# Patient Record
Sex: Male | Born: 1980 | Race: Black or African American | Hispanic: No | Marital: Single | Smoking: Never smoker
Health system: Southern US, Community
[De-identification: ages and names within clinical notes are randomized; demographics above are authoritative.]

## PROBLEM LIST (undated history)

## (undated) DIAGNOSIS — E119 Type 2 diabetes mellitus without complications: Secondary | ICD-10-CM

## (undated) HISTORY — PX: COSMETIC SURGERY: SHX468

---

## 2000-12-02 ENCOUNTER — Emergency Department (HOSPITAL_COMMUNITY): Admission: EM | Admit: 2000-12-02 | Discharge: 2000-12-02 | Payer: Self-pay | Admitting: Emergency Medicine

## 2001-09-04 ENCOUNTER — Emergency Department (HOSPITAL_COMMUNITY): Admission: EM | Admit: 2001-09-04 | Discharge: 2001-09-04 | Payer: Self-pay | Admitting: *Deleted

## 2001-09-09 ENCOUNTER — Emergency Department (HOSPITAL_COMMUNITY): Admission: EM | Admit: 2001-09-09 | Discharge: 2001-09-09 | Payer: Self-pay | Admitting: Emergency Medicine

## 2003-07-15 ENCOUNTER — Emergency Department (HOSPITAL_COMMUNITY): Admission: EM | Admit: 2003-07-15 | Discharge: 2003-07-15 | Payer: Self-pay | Admitting: *Deleted

## 2003-07-30 ENCOUNTER — Emergency Department (HOSPITAL_COMMUNITY): Admission: EM | Admit: 2003-07-30 | Discharge: 2003-07-30 | Payer: Self-pay | Admitting: Emergency Medicine

## 2005-11-28 ENCOUNTER — Emergency Department (HOSPITAL_COMMUNITY): Admission: EM | Admit: 2005-11-28 | Discharge: 2005-11-28 | Payer: Self-pay | Admitting: Emergency Medicine

## 2006-09-22 ENCOUNTER — Emergency Department (HOSPITAL_COMMUNITY): Admission: EM | Admit: 2006-09-22 | Discharge: 2006-09-22 | Payer: Self-pay | Admitting: Emergency Medicine

## 2006-12-27 ENCOUNTER — Inpatient Hospital Stay (HOSPITAL_COMMUNITY): Admission: EM | Admit: 2006-12-27 | Discharge: 2006-12-29 | Payer: Self-pay | Admitting: Emergency Medicine

## 2007-01-01 ENCOUNTER — Emergency Department (HOSPITAL_COMMUNITY): Admission: EM | Admit: 2007-01-01 | Discharge: 2007-01-01 | Payer: Self-pay | Admitting: Emergency Medicine

## 2007-06-30 ENCOUNTER — Emergency Department (HOSPITAL_COMMUNITY): Admission: EM | Admit: 2007-06-30 | Discharge: 2007-06-30 | Payer: Self-pay | Admitting: Emergency Medicine

## 2009-03-20 ENCOUNTER — Emergency Department (HOSPITAL_COMMUNITY): Admission: EM | Admit: 2009-03-20 | Discharge: 2009-03-20 | Payer: Self-pay | Admitting: Emergency Medicine

## 2009-03-29 ENCOUNTER — Emergency Department (HOSPITAL_COMMUNITY): Admission: EM | Admit: 2009-03-29 | Discharge: 2009-03-29 | Payer: Self-pay | Admitting: Emergency Medicine

## 2010-04-26 LAB — DIFFERENTIAL
Basophils Absolute: 0 10*3/uL (ref 0.0–0.1)
Basophils Relative: 0 % (ref 0–1)
Eosinophils Absolute: 0.1 10*3/uL (ref 0.0–0.7)
Eosinophils Relative: 1 % (ref 0–5)
Lymphocytes Relative: 15 % (ref 12–46)
Lymphs Abs: 1.5 10*3/uL (ref 0.7–4.0)
Monocytes Absolute: 1.1 10*3/uL — ABNORMAL HIGH (ref 0.1–1.0)
Monocytes Relative: 11 % (ref 3–12)
Neutro Abs: 7.5 10*3/uL (ref 1.7–7.7)
Neutrophils Relative %: 73 % (ref 43–77)

## 2010-04-26 LAB — BASIC METABOLIC PANEL WITH GFR
BUN: 12 mg/dL (ref 6–23)
CO2: 32 meq/L (ref 19–32)
Calcium: 9.4 mg/dL (ref 8.4–10.5)
Chloride: 100 meq/L (ref 96–112)
Creatinine, Ser: 1.02 mg/dL (ref 0.4–1.5)
GFR calc non Af Amer: >60 mL/min
Glucose, Bld: 93 mg/dL (ref 70–99)
Potassium: 4.1 meq/L (ref 3.5–5.1)
Sodium: 137 meq/L (ref 135–145)

## 2010-04-26 LAB — CBC
HCT: 49.5 % (ref 39.0–52.0)
Hemoglobin: 16.6 g/dL (ref 13.0–17.0)
MCHC: 33.6 g/dL (ref 30.0–36.0)
MCV: 84.8 fL (ref 78.0–100.0)
Platelets: 170 10*3/uL (ref 150–400)
RBC: 5.84 MIL/uL — ABNORMAL HIGH (ref 4.22–5.81)
RDW: 12.7 % (ref 11.5–15.5)
WBC: 10.2 10*3/uL (ref 4.0–10.5)

## 2010-04-26 LAB — GLUCOSE, CAPILLARY: Glucose-Capillary: 113 mg/dL — ABNORMAL HIGH (ref 70–99)

## 2010-04-26 LAB — URINALYSIS, ROUTINE W REFLEX MICROSCOPIC
Bilirubin Urine: NEGATIVE
Hgb urine dipstick: NEGATIVE
Protein, ur: NEGATIVE mg/dL
Urobilinogen, UA: 0.2 mg/dL (ref 0.0–1.0)

## 2010-04-26 LAB — RAPID URINE DRUG SCREEN, HOSP PERFORMED
Amphetamines: NOT DETECTED
Benzodiazepines: NOT DETECTED
Opiates: NOT DETECTED
Tetrahydrocannabinol: NOT DETECTED

## 2010-04-26 LAB — ETHANOL: Alcohol, Ethyl (B): <5 mg/dL (ref 0–10)
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
## 2010-11-14 LAB — I-STAT 8, (EC8 V) (CONVERTED LAB)
Acid-Base Excess: 3 — ABNORMAL HIGH
Chloride: 95 — ABNORMAL LOW
HCT: 55 — ABNORMAL HIGH
Hemoglobin: 18.7 — ABNORMAL HIGH
Operator id: 132501
Potassium: 5.5 — ABNORMAL HIGH
Sodium: 125 — ABNORMAL LOW
TCO2: 30
pH, Ven: 7.385 — ABNORMAL HIGH

## 2010-11-14 LAB — DIFFERENTIAL
Basophils Absolute: 0
Basophils Absolute: 0.1
Basophils Relative: 0
Eosinophils Absolute: 0.1 — ABNORMAL LOW
Eosinophils Relative: 1
Lymphocytes Relative: 22
Lymphs Abs: 1.4
Monocytes Relative: 7
Monocytes Relative: 8
Neutrophils Relative %: 69

## 2010-11-14 LAB — HEMOGLOBIN A1C
Hgb A1c MFr Bld: 14.2 — ABNORMAL HIGH
Hgb A1c MFr Bld: 14.4 — ABNORMAL HIGH
Mean Plasma Glucose: 435

## 2010-11-14 LAB — COMPREHENSIVE METABOLIC PANEL
ALT: 15
Alkaline Phosphatase: 43
BUN: 13
CO2: 30
Chloride: 101
GFR calc non Af Amer: >60
Glucose, Bld: 360 — ABNORMAL HIGH
Potassium: 3.7
Sodium: 134 — ABNORMAL LOW
Total Bilirubin: 1.1
Total Protein: 5.7 — ABNORMAL LOW

## 2010-11-14 LAB — BASIC METABOLIC PANEL
BUN: 18
Calcium: 9.2
Calcium: 9.6
GFR calc non Af Amer: >60
GFR calc non Af Amer: >60
Glucose, Bld: 166 — ABNORMAL HIGH
Potassium: 5.4 — ABNORMAL HIGH
Sodium: 128 — ABNORMAL LOW
Sodium: 139

## 2010-11-14 LAB — KETONES, QUALITATIVE: Acetone, Bld: NEGATIVE

## 2010-11-14 LAB — URINALYSIS, ROUTINE W REFLEX MICROSCOPIC
Bilirubin Urine: NEGATIVE
Hgb urine dipstick: NEGATIVE
Ketones, ur: NEGATIVE
Nitrite: NEGATIVE
Protein, ur: NEGATIVE
Urobilinogen, UA: 0.2

## 2010-11-14 LAB — CBC
HCT: 50.7
Hemoglobin: 14.8
MCHC: 33.6
Platelets: 164
Platelets: 179
RDW: 12.6
WBC: 6.4

## 2010-11-14 LAB — URINE MICROSCOPIC-ADD ON

## 2010-11-14 LAB — RAPID URINE DRUG SCREEN, HOSP PERFORMED
Amphetamines: NOT DETECTED
Benzodiazepines: NOT DETECTED
Cocaine: NOT DETECTED

## 2012-01-05 ENCOUNTER — Encounter (HOSPITAL_COMMUNITY): Payer: Self-pay

## 2012-01-05 ENCOUNTER — Emergency Department (HOSPITAL_COMMUNITY): Payer: No Typology Code available for payment source

## 2012-01-05 ENCOUNTER — Emergency Department (HOSPITAL_COMMUNITY)
Admission: EM | Admit: 2012-01-05 | Discharge: 2012-01-05 | Disposition: A | Discharge status: Home / Self Care | Payer: No Typology Code available for payment source | Attending: Emergency Medicine | Admitting: Emergency Medicine

## 2012-01-05 DIAGNOSIS — Y9389 Activity, other specified: Secondary | ICD-10-CM | POA: Insufficient documentation

## 2012-01-05 DIAGNOSIS — E119 Type 2 diabetes mellitus without complications: Secondary | ICD-10-CM | POA: Insufficient documentation

## 2012-01-05 DIAGNOSIS — S139XXA Sprain of joints and ligaments of unspecified parts of neck, initial encounter: Secondary | ICD-10-CM | POA: Insufficient documentation

## 2012-01-05 DIAGNOSIS — S161XXA Strain of muscle, fascia and tendon at neck level, initial encounter: Secondary | ICD-10-CM

## 2012-01-05 DIAGNOSIS — IMO0002 Reserved for concepts with insufficient information to code with codable children: Secondary | ICD-10-CM | POA: Insufficient documentation

## 2012-01-05 DIAGNOSIS — Y92410 Unspecified street and highway as the place of occurrence of the external cause: Secondary | ICD-10-CM | POA: Insufficient documentation

## 2012-01-05 HISTORY — DX: Type 2 diabetes mellitus without complications: E11.9

## 2012-01-05 MED ORDER — HYDROCODONE-ACETAMINOPHEN 5-325 MG PO TABS: 1.0000 | ORAL_TABLET | Freq: Four times a day (QID) | ORAL | Status: AC | PRN

## 2012-01-05 NOTE — ED Notes (Signed)
c-collar placed in triage d/t complaints of neck pain.  
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
## 2012-01-05 NOTE — ED Provider Notes (Signed)
History/physical exam/procedure(s) were performed by non-physician practitioner and as supervising physician I was immediately available for consultation/collaboration. I have reviewed all notes and am in agreement with care and plan.   Hilario Quarry, MD 01/05/12 [phone]
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
## 2012-02-06 ENCOUNTER — Emergency Department (HOSPITAL_COMMUNITY)
Admission: EM | Admit: 2012-02-06 | Discharge: 2012-02-06 | Disposition: A | Discharge status: Home / Self Care | Payer: No Typology Code available for payment source | Attending: Emergency Medicine | Admitting: Emergency Medicine

## 2012-02-06 ENCOUNTER — Encounter (HOSPITAL_COMMUNITY): Payer: Self-pay

## 2012-02-06 DIAGNOSIS — S335XXA Sprain of ligaments of lumbar spine, initial encounter: Secondary | ICD-10-CM | POA: Insufficient documentation

## 2012-02-06 DIAGNOSIS — X58XXXA Exposure to other specified factors, initial encounter: Secondary | ICD-10-CM | POA: Insufficient documentation

## 2012-02-06 DIAGNOSIS — E119 Type 2 diabetes mellitus without complications: Secondary | ICD-10-CM | POA: Insufficient documentation

## 2012-02-06 DIAGNOSIS — S39012A Strain of muscle, fascia and tendon of lower back, initial encounter: Secondary | ICD-10-CM

## 2012-02-06 DIAGNOSIS — Y929 Unspecified place or not applicable: Secondary | ICD-10-CM | POA: Insufficient documentation

## 2012-02-06 DIAGNOSIS — Y939 Activity, unspecified: Secondary | ICD-10-CM | POA: Insufficient documentation

## 2012-02-06 MED ORDER — CYCLOBENZAPRINE HCL 10 MG PO TABS
10.0000 mg | ORAL_TABLET | Freq: Once | ORAL | Status: AC
  Administered 2012-02-06: 10 mg via ORAL
  Filled 2012-02-06: qty 1

## 2012-02-06 MED ORDER — IBUPROFEN 800 MG PO TABS
800.0000 mg | ORAL_TABLET | Freq: Once | ORAL | Status: AC
  Administered 2012-02-06: 800 mg via ORAL
  Filled 2012-02-06: qty 1

## 2012-02-06 MED ORDER — CYCLOBENZAPRINE HCL 10 MG PO TABS: ORAL_TABLET | ORAL | Status: DC
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
## 2012-02-06 NOTE — ED Notes (Signed)
Pt reports cbg was 146 this morning.

## 2012-02-08 NOTE — ED Provider Notes (Signed)
Medical screening examination/treatment/procedure(s) were performed by non-physician practitioner and as supervising physician I was immediately available for consultation/collaboration.   Shelda Jakes, MD 02/08/12 2039

## 2012-06-11 ENCOUNTER — Emergency Department (HOSPITAL_COMMUNITY)
Admission: EM | Admit: 2012-06-11 | Discharge: 2012-06-12 | Disposition: A | Discharge status: Home / Self Care | Payer: Self-pay | Attending: Emergency Medicine | Admitting: Emergency Medicine

## 2012-06-11 ENCOUNTER — Encounter (HOSPITAL_COMMUNITY): Payer: Self-pay | Admitting: Emergency Medicine

## 2012-06-11 DIAGNOSIS — E1169 Type 2 diabetes mellitus with other specified complication: Secondary | ICD-10-CM | POA: Insufficient documentation

## 2012-06-11 DIAGNOSIS — T7492XA Unspecified child maltreatment, confirmed, initial encounter: Secondary | ICD-10-CM | POA: Insufficient documentation

## 2012-06-11 DIAGNOSIS — R739 Hyperglycemia, unspecified: Secondary | ICD-10-CM

## 2012-06-11 DIAGNOSIS — T7491XA Unspecified adult maltreatment, confirmed, initial encounter: Secondary | ICD-10-CM | POA: Insufficient documentation

## 2012-06-11 DIAGNOSIS — IMO0002 Reserved for concepts with insufficient information to code with codable children: Secondary | ICD-10-CM | POA: Insufficient documentation

## 2012-06-11 LAB — GLUCOSE, CAPILLARY: Glucose-Capillary: 446 mg/dL — ABNORMAL HIGH (ref 70–99)

## 2012-06-11 MED ORDER — SODIUM CHLORIDE 0.9 % IV BOLUS (SEPSIS)
1000.0000 mL | Freq: Once | INTRAVENOUS | Status: AC
  Administered 2012-06-12: 1000 mL via INTRAVENOUS

## 2012-06-11 MED ORDER — IBUPROFEN 800 MG PO TABS
800.0000 mg | ORAL_TABLET | Freq: Once | ORAL | Status: AC
  Administered 2012-06-12: 800 mg via ORAL
  Filled 2012-06-11: qty 1

## 2012-06-11 MED ORDER — INSULIN ASPART 100 UNIT/ML ~~LOC~~ SOLN
10.0000 [IU] | Freq: Once | SUBCUTANEOUS | Status: AC
  Administered 2012-06-12: 10 [IU] via INTRAVENOUS
  Filled 2012-06-11: qty 1
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
## 2012-12-24 ENCOUNTER — Emergency Department (HOSPITAL_COMMUNITY)
Admission: EM | Admit: 2012-12-24 | Discharge: 2012-12-24 | Disposition: A | Discharge status: Home / Self Care | Payer: No Typology Code available for payment source | Attending: Emergency Medicine | Admitting: Emergency Medicine

## 2012-12-24 ENCOUNTER — Encounter (HOSPITAL_COMMUNITY): Payer: Self-pay | Admitting: Emergency Medicine

## 2012-12-24 ENCOUNTER — Emergency Department (HOSPITAL_COMMUNITY): Payer: No Typology Code available for payment source

## 2012-12-24 DIAGNOSIS — E119 Type 2 diabetes mellitus without complications: Secondary | ICD-10-CM | POA: Insufficient documentation

## 2012-12-24 DIAGNOSIS — R0789 Other chest pain: Secondary | ICD-10-CM

## 2012-12-24 DIAGNOSIS — R0602 Shortness of breath: Secondary | ICD-10-CM | POA: Insufficient documentation

## 2012-12-24 DIAGNOSIS — R071 Chest pain on breathing: Secondary | ICD-10-CM | POA: Insufficient documentation

## 2012-12-24 DIAGNOSIS — Z794 Long term (current) use of insulin: Secondary | ICD-10-CM | POA: Insufficient documentation

## 2012-12-24 LAB — CBC WITH DIFFERENTIAL/PLATELET
Basophils Relative: 0 % (ref 0–1)
Eosinophils Absolute: 0 10*3/uL (ref 0.0–0.7)
HCT: 47 % (ref 39.0–52.0)
Hemoglobin: 16.3 g/dL (ref 13.0–17.0)
MCH: 29 pg (ref 26.0–34.0)
MCHC: 34.7 g/dL (ref 30.0–36.0)
Monocytes Absolute: 0.5 10*3/uL (ref 0.1–1.0)
Monocytes Relative: 9 % (ref 3–12)

## 2012-12-24 LAB — COMPREHENSIVE METABOLIC PANEL
Albumin: 4 g/dL (ref 3.5–5.2)
BUN: 12 mg/dL (ref 6–23)
Calcium: 9.7 mg/dL (ref 8.4–10.5)
Creatinine, Ser: 1.03 mg/dL (ref 0.50–1.35)
Total Protein: 7.3 g/dL (ref 6.0–8.3)

## 2012-12-24 LAB — GLUCOSE, CAPILLARY: Glucose-Capillary: 215 mg/dL — ABNORMAL HIGH (ref 70–99)

## 2012-12-24 LAB — TROPONIN I: Troponin I: <0.3 ng/mL (ref <0.30)

## 2012-12-24 MED ORDER — SODIUM CHLORIDE 0.9 % IV BOLUS (SEPSIS): 1000.0000 mL | Freq: Once | INTRAVENOUS | Status: DC

## 2012-12-24 MED ORDER — INSULIN ASPART 100 UNIT/ML ~~LOC~~ SOLN
20.0000 [IU] | Freq: Once | SUBCUTANEOUS | Status: AC
  Administered 2012-12-24: 20 [IU] via SUBCUTANEOUS
  Filled 2012-12-24: qty 1

## 2012-12-24 MED ORDER — HYDROCODONE-ACETAMINOPHEN 5-325 MG PO TABS
1.0000 | ORAL_TABLET | Freq: Once | ORAL | Status: AC
  Administered 2012-12-24: 1 via ORAL
  Filled 2012-12-24: qty 1

## 2012-12-24 MED ORDER — TRAMADOL HCL 50 MG PO TABS: 50.0000 mg | ORAL_TABLET | Freq: Four times a day (QID) | ORAL | Status: DC | PRN

## 2012-12-24 NOTE — ED Notes (Signed)
Complain of generalized chest pain. Worse when taking a deep breath

## 2012-12-24 NOTE — ED Provider Notes (Signed)
CSN: [id number]     Arrival date & time 12/24/12  1237 History  This chart was scribed for Benny Lennert, MD by Valera Castle, ED Scribe. This patient was seen in room APA05/APA05 and the patient's care was started at 1:53 PM.   Chief Complaint  Patient presents with  . Chest Pain   (Consider location/radiation/quality/duration/timing/severity/associated sxs/prior Treatment) Patient is a 32 y.o. male presenting with chest pain. The history is provided by the patient. No language interpreter was used.  Chest Pain Pain location:  L chest Pain radiates to:  Does not radiate Pain radiates to the back: no   Pain severity:  Moderate Onset quality:  Sudden Duration:  1 day Timing:  Intermittent Progression:  Unchanged Chronicity:  New Relieved by:  Nothing Worsened by:  Deep breathing and movement Associated symptoms: shortness of breath   Associated symptoms: no abdominal pain, no back pain, no cough, no fatigue and no headache    HPI Comments: Roy Savage is a 32 y.o. male with a h/o DM who presents to the Emergency Department complaining of sudden, moderate, constant, left sided chest pain, with associated SOB, onset last night. He reports that he is currently experiencing the chest pain. He reports that movement and deep breathing exacerbates the pain. He denies any other associated symptoms. He denies any other medical history.   PCP Kirstie Peri, MD  Past Medical History  Diagnosis Date  . Diabetes mellitus without complication    No past surgical history on file. No family history on file. History  Substance Use Topics  . Smoking status: Never Smoker   . Smokeless tobacco: Not on file  . Alcohol Use: No    Review of Systems  Constitutional: Negative for appetite change and fatigue.  HENT: Negative for congestion, ear discharge and sinus pressure.   Eyes: Negative for discharge.  Respiratory: Positive for shortness of breath. Negative for cough.   Cardiovascular:  Positive for chest pain.  Gastrointestinal: Negative for abdominal pain and diarrhea.  Genitourinary: Negative for frequency and hematuria.  Musculoskeletal: Negative for back pain.  Skin: Negative for rash.  Neurological: Negative for seizures and headaches.  Psychiatric/Behavioral: Negative for hallucinations.    Allergies  Review of patient's allergies indicates no known allergies.  Home Medications   Current Outpatient Rx  Name  Route  Sig  Dispense  Refill  . insulin glargine (LANTUS) 100 UNIT/ML injection   Subcutaneous   Inject 40 Units into the skin daily.          BP 134/70  Pulse 62  Temp(Src) 97.3 F (36.3 C) (Oral)  Resp 18  Ht 5\' 11"  (1.803 m)  Wt 180 lb (81.647 kg)  BMI 25.12 kg/m2  SpO2 99%  Physical Exam  Nursing note and vitals reviewed. Constitutional: He is oriented to person, place, and time. He appears well-developed.  HENT:  Head: Normocephalic.  Eyes: Conjunctivae and EOM are normal. No scleral icterus.  Neck: Neck supple. No thyromegaly present.  Cardiovascular: Normal rate and regular rhythm.  Exam reveals no gallop and no friction rub.   No murmur heard. Pulmonary/Chest: No stridor. He has no wheezes. He has no rales. He exhibits no tenderness.  Moderate tenderness to left anterior chest wall.  Abdominal: He exhibits no distension. There is no tenderness. There is no rebound.  Musculoskeletal: Normal range of motion. He exhibits no edema.  Lymphadenopathy:    He has no cervical adenopathy.  Neurological: He is oriented to person, place, and time.  He exhibits normal muscle tone. Coordination normal.  Skin: No rash noted. No erythema.  Psychiatric: He has a normal mood and affect. His behavior is normal.    ED Course  Procedures (including critical care time)  DIAGNOSTIC STUDIES: Oxygen Saturation is 99% on room air, normal by my interpretation.    COORDINATION OF CARE: 1:57 PM-Discussed treatment plan which includes CXR and EKG with  pt at bedside and pt agreed to plan.   Labs Review Labs Reviewed  COMPREHENSIVE METABOLIC PANEL - Abnormal; Notable for the following:    Sodium 129 (*)    Chloride 91 (*)    Glucose, Bld 586 (*)    All other components within normal limits  CBC WITH DIFFERENTIAL  TROPONIN I   Imaging Review Dg Chest 2 View  12/24/2012   CLINICAL DATA:  Chest pain and shortness of Breath.  EXAM: CHEST  2 VIEW  COMPARISON:  12/27/2006.  FINDINGS: The heart size and mediastinal contours are within normal limits. Both lungs are clear. The visualized skeletal structures are unremarkable. Stable mild mid thoracic compression deformity  IMPRESSION: No active cardiopulmonary disease.   Electronically Signed   By: Loralie Champagne M.D.   On: 12/24/2012 13:09    EKG Interpretation    Date/Time:  Wednesday December 24 2012 12:40:41 EST Ventricular Rate:  64 PR Interval:  126 QRS Duration: 112 QT Interval:  392 QTC Calculation: 404 R Axis:   58 Text Interpretation:  Normal sinus rhythm T wave abnormality, consider inferior ischemia Abnormal ECG When compared with ECG of 28-Dec-2006 01:22, No significant change was found Confirmed by COOK  MD, BRIAN ([id number]) on 12/24/2012 1:10:02 PM           Results for orders placed during the hospital encounter of 12/24/12  CBC WITH DIFFERENTIAL      Result Value Range   WBC 5.6  4.0 - 10.5 K/uL   RBC 5.62  4.22 - 5.81 MIL/uL   Hemoglobin 16.3  13.0 - 17.0 g/dL   HCT 11.9  14.7 - 82.9 %   MCV 83.6  78.0 - 100.0 fL   MCH 29.0  26.0 - 34.0 pg   MCHC 34.7  30.0 - 36.0 g/dL   RDW 56.2  13.0 - 86.5 %   Platelets 160  150 - 400 K/uL   Neutrophils Relative % 63  43 - 77 %   Neutro Abs 3.5  1.7 - 7.7 K/uL   Lymphocytes Relative 27  12 - 46 %   Lymphs Abs 1.5  0.7 - 4.0 K/uL   Monocytes Relative 9  3 - 12 %   Monocytes Absolute 0.5  0.1 - 1.0 K/uL   Eosinophils Relative 1  0 - 5 %   Eosinophils Absolute 0.0  0.0 - 0.7 K/uL   Basophils Relative 0  0 - 1 %    Basophils Absolute 0.0  0.0 - 0.1 K/uL  COMPREHENSIVE METABOLIC PANEL      Result Value Range   Sodium 129 (*) 135 - 145 mEq/L   Potassium 4.7  3.5 - 5.1 mEq/L   Chloride 91 (*) 96 - 112 mEq/L   CO2 30  19 - 32 mEq/L   Glucose, Bld 586 (*) 70 - 99 mg/dL   BUN 12  6 - 23 mg/dL   Creatinine, Ser 7.84  0.50 - 1.35 mg/dL   Calcium 9.7  8.4 - 69.6 mg/dL   Total Protein 7.3  6.0 - 8.3 g/dL   Albumin 4.0  3.5 - 5.2 g/dL   AST 15  0 - 37 U/L   ALT 13  0 - 53 U/L   Alkaline Phosphatase 49  39 - 117 U/L   Total Bilirubin 1.2  0.3 - 1.2 mg/dL   GFR calc non Af Amer >90  >90 mL/min   GFR calc Af Amer >90  >90 mL/min  TROPONIN I      Result Value Range   Troponin I <0.30  <0.30 ng/mL    MDM  No diagnosis found.   Chest wall pain,  Diabetes.  Pt to follow up with pcp   The chart was scribed for me under my direct supervision.  I personally performed the history, physical, and medical decision making and all procedures in the evaluation of this patient.Benny Lennert, MD 12/24/12 [phone]

## 2012-12-24 NOTE — ED Notes (Signed)
Pt alert & oriented x4, stable gait. Patient given discharge instructions, paperwork & prescription(s). Patient  instructed to stop at the registration desk to finish any additional paperwork. Patient verbalized understanding. Pt left department w/ no further questions. 

## 2013-11-05 ENCOUNTER — Encounter (HOSPITAL_COMMUNITY): Payer: Self-pay | Admitting: Emergency Medicine

## 2013-11-05 ENCOUNTER — Emergency Department (HOSPITAL_COMMUNITY): Payer: Self-pay

## 2013-11-05 ENCOUNTER — Inpatient Hospital Stay (HOSPITAL_COMMUNITY)
Admission: EM | Admit: 2013-11-05 | Discharge: 2013-11-07 | DRG: 638 | Disposition: A | Discharge status: Home / Self Care | Payer: Self-pay | Attending: Internal Medicine | Admitting: Internal Medicine

## 2013-11-05 DIAGNOSIS — E1310 Other specified diabetes mellitus with ketoacidosis without coma: Principal | ICD-10-CM | POA: Diagnosis present

## 2013-11-05 DIAGNOSIS — E872 Acidosis, unspecified: Secondary | ICD-10-CM

## 2013-11-05 DIAGNOSIS — E1110 Type 2 diabetes mellitus with ketoacidosis without coma: Secondary | ICD-10-CM | POA: Diagnosis present

## 2013-11-05 DIAGNOSIS — J029 Acute pharyngitis, unspecified: Secondary | ICD-10-CM | POA: Diagnosis present

## 2013-11-05 DIAGNOSIS — Z91013 Allergy to seafood: Secondary | ICD-10-CM

## 2013-11-05 DIAGNOSIS — E0810 Diabetes mellitus due to underlying condition with ketoacidosis without coma: Secondary | ICD-10-CM

## 2013-11-05 DIAGNOSIS — E875 Hyperkalemia: Secondary | ICD-10-CM

## 2013-11-05 DIAGNOSIS — Z9111 Patient's noncompliance with dietary regimen: Secondary | ICD-10-CM | POA: Diagnosis present

## 2013-11-05 DIAGNOSIS — Z9114 Patient's other noncompliance with medication regimen: Secondary | ICD-10-CM | POA: Diagnosis present

## 2013-11-05 DIAGNOSIS — Z23 Encounter for immunization: Secondary | ICD-10-CM

## 2013-11-05 LAB — BASIC METABOLIC PANEL
ANION GAP: 35 — AB (ref 5–15)
ANION GAP: 32 — AB (ref 5–15)
ANION GAP: 26 — AB (ref 5–15)
ANION GAP: 21 — AB (ref 5–15)
Anion gap: 38 — ABNORMAL HIGH (ref 5–15)
BUN: 34 mg/dL — ABNORMAL HIGH (ref 6–23)
BUN: 33 mg/dL — ABNORMAL HIGH (ref 6–23)
BUN: 33 mg/dL — ABNORMAL HIGH (ref 6–23)
BUN: 30 mg/dL — ABNORMAL HIGH (ref 6–23)
BUN: 27 mg/dL — ABNORMAL HIGH (ref 6–23)
CALCIUM: 10.3 mg/dL (ref 8.4–10.5)
CALCIUM: 10.5 mg/dL (ref 8.4–10.5)
CALCIUM: 10.3 mg/dL (ref 8.4–10.5)
CALCIUM: 10 mg/dL (ref 8.4–10.5)
CHLORIDE: 85 meq/L — AB (ref 96–112)
CO2: 11 mEq/L — ABNORMAL LOW (ref 19–32)
CO2: 11 meq/L — AB (ref 19–32)
CO2: 13 meq/L — AB (ref 19–32)
CO2: 15 mEq/L — ABNORMAL LOW (ref 19–32)
CO2: 19 meq/L (ref 19–32)
Calcium: 10.7 mg/dL — ABNORMAL HIGH (ref 8.4–10.5)
Chloride: 91 mEq/L — ABNORMAL LOW (ref 96–112)
Chloride: 96 mEq/L (ref 96–112)
Chloride: 98 mEq/L (ref 96–112)
Chloride: 99 mEq/L (ref 96–112)
Creatinine, Ser: 1.59 mg/dL — ABNORMAL HIGH (ref 0.50–1.35)
Creatinine, Ser: 1.44 mg/dL — ABNORMAL HIGH (ref 0.50–1.35)
Creatinine, Ser: 1.49 mg/dL — ABNORMAL HIGH (ref 0.50–1.35)
Creatinine, Ser: 1.41 mg/dL — ABNORMAL HIGH (ref 0.50–1.35)
Creatinine, Ser: 1.26 mg/dL (ref 0.50–1.35)
GFR calc non Af Amer: 55 mL/min — ABNORMAL LOW (ref >90)
GFR calc non Af Amer: 62 mL/min — ABNORMAL LOW (ref >90)
GFR calc non Af Amer: 59 mL/min — ABNORMAL LOW (ref >90)
GFR calc non Af Amer: 63 mL/min — ABNORMAL LOW (ref >90)
GFR, EST AFRICAN AMERICAN: 63 mL/min — AB (ref >90)
GFR, EST AFRICAN AMERICAN: 72 mL/min — AB (ref >90)
GFR, EST AFRICAN AMERICAN: 69 mL/min — AB (ref >90)
GFR, EST AFRICAN AMERICAN: 73 mL/min — AB (ref >90)
GFR, EST AFRICAN AMERICAN: 84 mL/min — AB (ref >90)
GFR, EST NON AFRICAN AMERICAN: 73 mL/min — AB (ref >90)
Glucose, Bld: 692 mg/dL (ref 70–99)
Glucose, Bld: 629 mg/dL (ref 70–99)
Glucose, Bld: 494 mg/dL — ABNORMAL HIGH (ref 70–99)
Glucose, Bld: 355 mg/dL — ABNORMAL HIGH (ref 70–99)
Glucose, Bld: 262 mg/dL — ABNORMAL HIGH (ref 70–99)
POTASSIUM: 7.4 meq/L — AB (ref 3.7–5.3)
Potassium: 6.9 mEq/L (ref 3.7–5.3)
Potassium: 5.4 mEq/L — ABNORMAL HIGH (ref 3.7–5.3)
Potassium: 5.1 mEq/L (ref 3.7–5.3)
Potassium: 4.9 mEq/L (ref 3.7–5.3)
SODIUM: 134 meq/L — AB (ref 137–147)
SODIUM: 137 meq/L (ref 137–147)
SODIUM: 141 meq/L (ref 137–147)
SODIUM: 139 meq/L (ref 137–147)
SODIUM: 139 meq/L (ref 137–147)

## 2013-11-05 LAB — GLUCOSE, CAPILLARY
GLUCOSE-CAPILLARY: 590 mg/dL — AB (ref 70–99)
GLUCOSE-CAPILLARY: 366 mg/dL — AB (ref 70–99)
GLUCOSE-CAPILLARY: 169 mg/dL — AB (ref 70–99)
Glucose-Capillary: 500 mg/dL — ABNORMAL HIGH (ref 70–99)
Glucose-Capillary: 446 mg/dL — ABNORMAL HIGH (ref 70–99)
Glucose-Capillary: 280 mg/dL — ABNORMAL HIGH (ref 70–99)
Glucose-Capillary: 230 mg/dL — ABNORMAL HIGH (ref 70–99)
Glucose-Capillary: 207 mg/dL — ABNORMAL HIGH (ref 70–99)

## 2013-11-05 LAB — URINALYSIS, ROUTINE W REFLEX MICROSCOPIC
Bilirubin Urine: NEGATIVE
Glucose, UA: >1000 mg/dL — AB
Ketones, ur: 40 mg/dL — AB
LEUKOCYTES UA: NEGATIVE
Nitrite: NEGATIVE
Protein, ur: NEGATIVE mg/dL
Specific Gravity, Urine: 1.025 (ref 1.005–1.030)
Urobilinogen, UA: 0.2 mg/dL (ref 0.0–1.0)
pH: 5.5 (ref 5.0–8.0)

## 2013-11-05 LAB — CBC WITH DIFFERENTIAL/PLATELET
BASOS ABS: 0 10*3/uL (ref 0.0–0.1)
BASOS PCT: 0 % (ref 0–1)
Eosinophils Absolute: 0 10*3/uL (ref 0.0–0.7)
Eosinophils Relative: 0 % (ref 0–5)
HCT: 51.7 % (ref 39.0–52.0)
Hemoglobin: 17.8 g/dL — ABNORMAL HIGH (ref 13.0–17.0)
Lymphocytes Relative: 5 % — ABNORMAL LOW (ref 12–46)
Lymphs Abs: 0.9 10*3/uL (ref 0.7–4.0)
MCH: 29.4 pg (ref 26.0–34.0)
MCHC: 34.4 g/dL (ref 30.0–36.0)
MCV: 85.5 fL (ref 78.0–100.0)
Monocytes Absolute: 1 10*3/uL (ref 0.1–1.0)
Monocytes Relative: 5 % (ref 3–12)
NEUTROS ABS: 16 10*3/uL — AB (ref 1.7–7.7)
NEUTROS PCT: 90 % — AB (ref 43–77)
Platelets: 192 10*3/uL (ref 150–400)
RBC: 6.05 MIL/uL — ABNORMAL HIGH (ref 4.22–5.81)
RDW: 12.8 % (ref 11.5–15.5)
WBC: 17.9 10*3/uL — ABNORMAL HIGH (ref 4.0–10.5)

## 2013-11-05 LAB — CBC
HCT: 49.4 % (ref 39.0–52.0)
Hemoglobin: 17.4 g/dL — ABNORMAL HIGH (ref 13.0–17.0)
MCH: 29.7 pg (ref 26.0–34.0)
MCHC: 35.2 g/dL (ref 30.0–36.0)
MCV: 84.4 fL (ref 78.0–100.0)
PLATELETS: 191 10*3/uL (ref 150–400)
RBC: 5.85 MIL/uL — ABNORMAL HIGH (ref 4.22–5.81)
RDW: 12.7 % (ref 11.5–15.5)
WBC: 18.7 10*3/uL — AB (ref 4.0–10.5)

## 2013-11-05 LAB — URINE MICROSCOPIC-ADD ON

## 2013-11-05 LAB — BLOOD GAS, ARTERIAL
Acid-base deficit: 17.6 mmol/L — ABNORMAL HIGH (ref 0.0–2.0)
Bicarbonate: 9.3 mEq/L — ABNORMAL LOW (ref 20.0–24.0)
DRAWN BY: 27407
FIO2: 0.21 %
O2 Saturation: 95.5 %
PO2 ART: 101 mmHg — AB (ref 80.0–100.0)
Patient temperature: 37
TCO2: 8.4 mmol/L (ref 0–100)
pCO2 arterial: 27 mmHg — ABNORMAL LOW (ref 35.0–45.0)
pH, Arterial: 7.164 — CL (ref 7.350–7.450)

## 2013-11-05 LAB — I-STAT CG4 LACTIC ACID, ED: Lactic Acid, Venous: 7.94 mmol/L — ABNORMAL HIGH (ref 0.5–2.2)

## 2013-11-05 LAB — RAPID STREP SCREEN (MED CTR MEBANE ONLY): STREPTOCOCCUS, GROUP A SCREEN (DIRECT): NEGATIVE

## 2013-11-05 LAB — CBG MONITORING, ED: Glucose-Capillary: >600 mg/dL (ref 70–99)

## 2013-11-05 LAB — KETONES, QUALITATIVE

## 2013-11-05 MED ORDER — SODIUM CHLORIDE 0.9 % IV SOLN
INTRAVENOUS | Status: DC
  Administered 2013-11-05: 800 mL via INTRAVENOUS
  Administered 2013-11-05: 22:00:00 via INTRAVENOUS

## 2013-11-05 MED ORDER — SODIUM CHLORIDE 0.9 % IV SOLN
1000.0000 mL | Freq: Once | INTRAVENOUS | Status: AC
  Administered 2013-11-05: 1000 mL via INTRAVENOUS

## 2013-11-05 MED ORDER — ACETAMINOPHEN 650 MG RE SUPP: 650.0000 mg | Freq: Four times a day (QID) | RECTAL | Status: DC | PRN

## 2013-11-05 MED ORDER — SODIUM CHLORIDE 0.9 % IV SOLN: INTRAVENOUS | Status: AC

## 2013-11-05 MED ORDER — INSULIN REGULAR HUMAN 100 UNIT/ML IJ SOLN: INTRAMUSCULAR | Status: DC

## 2013-11-05 MED ORDER — DEXTROSE-NACL 5-0.45 % IV SOLN
INTRAVENOUS | Status: DC
  Administered 2013-11-06 (×2): via INTRAVENOUS

## 2013-11-05 MED ORDER — SODIUM BICARBONATE 8.4 % IV SOLN
50.0000 meq | Freq: Once | INTRAVENOUS | Status: AC
  Administered 2013-11-05: 50 meq via INTRAVENOUS
  Filled 2013-11-05: qty 50

## 2013-11-05 MED ORDER — SODIUM CHLORIDE 0.9 % IJ SOLN
3.0000 mL | Freq: Two times a day (BID) | INTRAMUSCULAR | Status: DC
  Administered 2013-11-05: 22:00:00 via INTRAVENOUS
  Administered 2013-11-06 – 2013-11-07 (×3): 3 mL via INTRAVENOUS

## 2013-11-05 MED ORDER — INFLUENZA VAC SPLIT QUAD 0.5 ML IM SUSY
0.5000 mL | PREFILLED_SYRINGE | INTRAMUSCULAR | Status: AC
  Administered 2013-11-06: 0.5 mL via INTRAMUSCULAR
  Filled 2013-11-05: qty 0.5

## 2013-11-05 MED ORDER — DEXTROSE 50 % IV SOLN: 25.0000 mL | INTRAVENOUS | Status: DC | PRN

## 2013-11-05 MED ORDER — ACETAMINOPHEN 325 MG PO TABS: 650.0000 mg | ORAL_TABLET | Freq: Four times a day (QID) | ORAL | Status: DC | PRN

## 2013-11-05 MED ORDER — PNEUMOCOCCAL VAC POLYVALENT 25 MCG/0.5ML IJ INJ
0.5000 mL | INJECTION | INTRAMUSCULAR | Status: AC
  Administered 2013-11-06: 0.5 mL via INTRAMUSCULAR
  Filled 2013-11-05: qty 0.5

## 2013-11-05 MED ORDER — SODIUM CHLORIDE 0.9 % IV SOLN: INTRAVENOUS | Status: DC

## 2013-11-05 MED ORDER — HEPARIN SODIUM (PORCINE) 5000 UNIT/ML IJ SOLN
5000.0000 [IU] | Freq: Three times a day (TID) | INTRAMUSCULAR | Status: DC
  Administered 2013-11-05 – 2013-11-06 (×4): 5000 [IU] via SUBCUTANEOUS
  Filled 2013-11-05 (×5): qty 1

## 2013-11-05 MED ORDER — SODIUM CHLORIDE 0.9 % IV SOLN
1000.0000 mL | INTRAVENOUS | Status: DC
  Administered 2013-11-05: 1000 mL via INTRAVENOUS

## 2013-11-05 MED ORDER — SODIUM CHLORIDE 0.9 % IV SOLN
INTRAVENOUS | Status: DC
  Administered 2013-11-05: 10.6 [IU]/h via INTRAVENOUS
  Administered 2013-11-05: 15:00:00 via INTRAVENOUS
  Administered 2013-11-05: 8.8 [IU]/h via INTRAVENOUS
  Filled 2013-11-05: qty 2.5

## 2013-11-05 MED ORDER — ONDANSETRON HCL 4 MG/2ML IJ SOLN
4.0000 mg | Freq: Once | INTRAMUSCULAR | Status: AC
  Administered 2013-11-05: 4 mg via INTRAVENOUS
  Filled 2013-11-05: qty 2

## 2013-11-05 MED ORDER — ONDANSETRON HCL 4 MG/2ML IJ SOLN: 4.0000 mg | Freq: Three times a day (TID) | INTRAMUSCULAR | Status: AC | PRN

## 2013-11-05 MED ORDER — SODIUM CHLORIDE 0.9 % IV SOLN
1.0000 g | Freq: Once | INTRAVENOUS | Status: AC
  Administered 2013-11-05: 1 g via INTRAVENOUS
  Filled 2013-11-05: qty 10

## 2013-11-05 MED ORDER — MORPHINE SULFATE 2 MG/ML IJ SOLN: 2.0000 mg | INTRAMUSCULAR | Status: DC | PRN

## 2013-11-05 NOTE — ED Notes (Signed)
CRITICAL VALUE ALERT  Critical value received: k- 7.4 [id number]  Date of notification: 11/05/2013  Time of notification: 1420  Critical value read back:Yes.    Nurse who received alert:  Malena Catholicsamantha Ly Bacchi  MD notified (1st page):  Rancour    Time MD responded:  1420

## 2013-11-05 NOTE — H&P (Addendum)
[facility] History and Physical  Roy GreaserKevin W Dangler VHQ:[id number] DOB: 01/10/1981 DOA: 11/05/2013  Referring physician: Emergency Department PCP: Kirstie PeriSHAH,ASHISH, MD  Specialists:   Chief Complaint: N/V  HPI: Roy Savage is a 33 y.o. male  With a hx of diabetes who presents to the ED with complaints of n/v several hours after eating tacos. In the ED, pt was noted to have a random glucose of 692 with an anion gap of 38 and pH of 7.1. The patient was started on IVF with insulin gtt started. Hosptialist service consulted for admission. Pt also noted to have K of 7.4. He was given calcium and bicarb in the ED.  Review of Systems:  Per above, the remainder of the 10pt ros reviewed and are neg  Past Medical History  Diagnosis Date  . Diabetes mellitus without complication    History reviewed. No pertinent past surgical history. Social History:  reports that he has never smoked. He does not have any smokeless tobacco history on file. He reports that he does not drink alcohol or use illicit drugs.  where does patient live--home, ALF, SNF? and with whom if at home?  Can patient participate in ADLs?  No Known Allergies  No family history on file. reviewed and is noncontributory to this particular case (be sure to complete)  Prior to Admission medications   Medication Sig Start Date End Date Taking? Authorizing Provider  insulin glargine (LANTUS) 100 UNIT/ML injection Inject 40 Units into the skin daily.    Historical Provider, MD  traMADol (ULTRAM) 50 MG tablet Take 1 tablet (50 mg total) by mouth every 6 (six) hours as needed. 12/24/12   Benny LennertJoseph L Zammit, MD   Physical Exam: Filed Vitals:   11/05/13 1239  BP: 134/78  Pulse: 110  Temp: 98 F (36.7 C)  TempSrc: Oral  Resp: 16  Height: 5\' 11"  (1.803 m)  Weight: 83.915 kg (185 lb)  SpO2: 100%     General:  Awake, in nad  Eyes: PERRL B  ENT: membranes dry, dentition fair  Neck: trachea midline, neck supple  Cardiovascular:  regular, s1, s2  Respiratory: normal resp effort, no wheezing  Abdomen: soft, nondistended  Skin: normal skin turgor, no abnormal skin lesions seen  Musculoskeletal: perfused, no clubbing  Psychiatric: mood/affect normal// no auditory/visual hallucinations  Neurologic: cn2-12 grossly intact, strength/sensation intact  Labs on Admission:  Basic Metabolic Panel:  Recent Labs Lab 11/05/13 1346  NA 134*  K 7.4*  CL 85*  CO2 11*  GLUCOSE 692*  BUN 34*  CREATININE 1.59*  CALCIUM 10.7*   Liver Function Tests: No results found for this basename: AST, ALT, ALKPHOS, BILITOT, PROT, ALBUMIN,  in the last 168 hours No results found for this basename: LIPASE, AMYLASE,  in the last 168 hours No results found for this basename: AMMONIA,  in the last 168 hours CBC:  Recent Labs Lab 11/05/13 1346  WBC 17.9*  NEUTROABS 16.0*  HGB 17.8*  HCT 51.7  MCV 85.5  PLT 192   Cardiac Enzymes: No results found for this basename: CKTOTAL, CKMB, CKMBINDEX, TROPONINI,  in the last 168 hours  BNP (last 3 results) No results found for this basename: PROBNP,  in the last 8760 hours CBG:  Recent Labs Lab 11/05/13 1246  GLUCAP >600*    Radiological Exams on Admission: Dg Chest 2 View  11/05/2013   CLINICAL DATA:  Nausea, vomiting.  EXAM: CHEST  2 VIEW  COMPARISON:  December 24, 2012.  FINDINGS: The heart  size and mediastinal contours are within normal limits. Both lungs are clear. No pneumothorax or pleural effusion is noted. The visualized skeletal structures are unremarkable.  IMPRESSION: No acute cardiopulmonary abnormality seen.   Electronically Signed   By: Roque Lias M.D.   On: 11/05/2013 14:38   EKG: Independently reviewed. Peaked T waves  Assessment/Plan Active Problems:   DKA (diabetic ketoacidoses)   1. DKA 1. Will cont pt on insulin gtt 2. Cont aggressive IVF per DKA protocol 3. Follow lytes closely 4. Will check a1c 5. Admit to stepdown 2. Hyperkalemia 1. Calcium  and bicarb given in Ed 2. Peaked T waves noted on EKG 3. Expect K to trend down as insulin gtt infuses 3. N/V 1. Likely secondary to above 2. Supportive care 4. DVT prophylaxis 1. Heparin subQ 5. Metabolic acidosis 1. Likely secondary to profound DKA per above 2. Pt given one amp of bicarb in the ED  Code Status: Full Family Communication: Pt in room Disposition Plan: Pending  Time spent:  CHIU, Scheryl Marten [facility] Pager [phone]  If 7PM-7AM, please contact night-coverage [email or web address] Password [email or web address] 11/05/2013, 2:55 PM

## 2013-11-05 NOTE — ED Notes (Signed)
Rancour informed of Lactic Acid 7.94

## 2013-11-05 NOTE — ED Provider Notes (Signed)
CSN: [id number]     Arrival date & time 11/05/13  1236 History  This chart was scribed for Roy Octave, MD by Tonye Royalty, ED Scribe. This patient was seen in room IC05/IC05-01 and the patient's care was started at 1:33 PM.    Chief Complaint  Patient presents with  . Emesis   The history is provided by the patient. No language interpreter was used.    HPI Comments: Roy Savage is a 33 y.o. male who presents to the Emergency Department complaining of vomiting with onset at 2:00AM today. He states he ate [employer] at 12:00AM. He reports associated sore throat, right ear pain, diarrhea, and mild abdominal pain. He denies having similar symptoms previously. He reports history of diabetes and states he took 30 of Lantus this morning instead of his usually 40. He denies fever, hematemesis  Past Medical History  Diagnosis Date  . Diabetes mellitus without complication    History reviewed. No pertinent past surgical history. No family history on file. History  Substance Use Topics  . Smoking status: Never Smoker   . Smokeless tobacco: Not on file  . Alcohol Use: No    Review of Systems  Constitutional: Negative for fever.  HENT: Positive for ear pain and sore throat.   Gastrointestinal: Positive for nausea, vomiting, abdominal pain and diarrhea.       Denies hematemesis  A complete 10 system review of systems was obtained and all systems are negative except as noted in the HPI and PMH.    Allergies  Shellfish allergy  Home Medications   Prior to Admission medications   Medication Sig Start Date End Date Taking? Authorizing Provider  insulin glargine (LANTUS) 100 UNIT/ML injection Inject 40 Units into the skin daily.   Yes Historical Provider, MD   BP 134/78  Pulse 98  Temp(Src) 98 F (36.7 C) (Oral)  Resp 19  Ht 5\' 11"  (1.803 m)  Wt 185 lb (83.915 kg)  BMI 25.81 kg/m2  SpO2 98% Physical Exam  Nursing note and vitals reviewed. Constitutional: He is oriented to  person, place, and time. He appears well-developed and well-nourished. No distress.  Appears ill  HENT:  Head: Normocephalic and atraumatic.  Mouth/Throat: Oropharynx is clear and moist. No oropharyngeal exudate.  Dry mucous membranes, scattered exudates in posterior pharynx, no asymmetry  Eyes: Conjunctivae and EOM are normal. Pupils are equal, round, and reactive to light.  Neck: Normal range of motion. Neck supple.  No meningismus.  Cardiovascular: Regular rhythm, normal heart sounds and intact distal pulses.   No murmur heard. tachycardic  Pulmonary/Chest: Effort normal and breath sounds normal. No respiratory distress.  Abdominal: Soft. There is no tenderness. There is no rebound and no guarding.  emesis bag with brown liquid  Musculoskeletal: Normal range of motion. He exhibits no edema and no tenderness.  Neurological: He is alert and oriented to person, place, and time. No cranial nerve deficit. He exhibits normal muscle tone. Coordination normal.  No ataxia on finger to nose bilaterally. No pronator drift. 5/5 strength throughout. CN 2-12 intact. Negative Romberg. Equal grip strength. Sensation intact. Gait is normal.   Skin: Skin is warm.  Psychiatric: He has a normal mood and affect. His behavior is normal.    ED Course  Procedures (including critical care time) Labs Review Labs Reviewed  BLOOD GAS, ARTERIAL - Abnormal; Notable for the following:    pH, Arterial 7.164 (*)    pCO2 arterial 27.0 (*)    pO2, Arterial  101.0 (*)    Bicarbonate 9.3 (*)    Acid-base deficit 17.6 (*)    All other components within normal limits  CBC WITH DIFFERENTIAL - Abnormal; Notable for the following:    WBC 17.9 (*)    RBC 6.05 (*)    Hemoglobin 17.8 (*)    Neutrophils Relative % 90 (*)    Neutro Abs 16.0 (*)    Lymphocytes Relative 5 (*)    All other components within normal limits  BASIC METABOLIC PANEL - Abnormal; Notable for the following:    Sodium 134 (*)    Potassium 7.4 (*)     Chloride 85 (*)    CO2 11 (*)    Glucose, Bld 692 (*)    BUN 34 (*)    Creatinine, Ser 1.59 (*)    Calcium 10.7 (*)    GFR calc non Af Amer 55 (*)    GFR calc Af Amer 63 (*)    Anion gap 38 (*)    All other components within normal limits  KETONES, QUALITATIVE - Abnormal; Notable for the following:    Acetone, Bld SMALL (*)    All other components within normal limits  URINALYSIS, ROUTINE W REFLEX MICROSCOPIC - Abnormal; Notable for the following:    Glucose, UA >1000 (*)    Hgb urine dipstick SMALL (*)    Ketones, ur 40 (*)    All other components within normal limits  BASIC METABOLIC PANEL - Abnormal; Notable for the following:    Potassium 6.9 (*)    Chloride 91 (*)    CO2 11 (*)    Glucose, Bld 629 (*)    BUN 33 (*)    Creatinine, Ser 1.44 (*)    GFR calc non Af Amer 62 (*)    GFR calc Af Amer 72 (*)    Anion gap 35 (*)    All other components within normal limits  BASIC METABOLIC PANEL - Abnormal; Notable for the following:    Potassium 5.4 (*)    CO2 13 (*)    Glucose, Bld 494 (*)    BUN 33 (*)    Creatinine, Ser 1.49 (*)    GFR calc non Af Amer 59 (*)    GFR calc Af Amer 69 (*)    Anion gap 32 (*)    All other components within normal limits  CBC - Abnormal; Notable for the following:    WBC 18.7 (*)    RBC 5.85 (*)    Hemoglobin 17.4 (*)    All other components within normal limits  GLUCOSE, CAPILLARY - Abnormal; Notable for the following:    Glucose-Capillary 590 (*)    All other components within normal limits  GLUCOSE, CAPILLARY - Abnormal; Notable for the following:    Glucose-Capillary 500 (*)    All other components within normal limits  GLUCOSE, CAPILLARY - Abnormal; Notable for the following:    Glucose-Capillary 446 (*)    All other components within normal limits  CBG MONITORING, ED - Abnormal; Notable for the following:    Glucose-Capillary >600 (*)    All other components within normal limits  I-STAT CG4 LACTIC ACID, ED - Abnormal;  Notable for the following:    Lactic Acid, Venous 7.94 (*)    All other components within normal limits  RAPID STREP SCREEN  CULTURE, GROUP A STREP  URINE MICROSCOPIC-ADD ON  OCCULT BLOOD GASTRIC / DUODENUM (SPECIMEN CUP)  BASIC METABOLIC PANEL  BASIC METABOLIC PANEL  HEMOGLOBIN  A1C  CBC  COMPREHENSIVE METABOLIC PANEL    Imaging Review Dg Chest 2 View  11/05/2013   CLINICAL DATA:  Nausea, vomiting.  EXAM: CHEST  2 VIEW  COMPARISON:  December 24, 2012.  FINDINGS: The heart size and mediastinal contours are within normal limits. Both lungs are clear. No pneumothorax or pleural effusion is noted. The visualized skeletal structures are unremarkable.  IMPRESSION: No acute cardiopulmonary abnormality seen.   Electronically Signed   By: Roque Lias M.D.   On: 11/05/2013 14:38     EKG Interpretation   Date/Time:  Thursday November 05 2013 14:36:14 EDT Ventricular Rate:  99 PR Interval:  154 QRS Duration: 95 QT Interval:  348 QTC Calculation: 447 R Axis:   73 Text Interpretation:  Sinus rhythm Biatrial enlargement Anteroseptal  infarct, old Rate faster peaked T waves Confirmed by Brittanny Levenhagen  MD, Pardeep Pautz  [phone]) on 11/05/2013 2:40:47 PM     DIAGNOSTIC STUDIES: Oxygen Saturation is 100% on room air, normal by my interpretation.    COORDINATION OF CARE: 1:38 PM Discussed treatment plan with patient at beside, the patient agrees with the plan and has no further questions at this time.   MDM   Final diagnoses:  Diabetic ketoacidosis without coma associated with diabetes mellitus due to underlying condition   Patient complains of nausea, vomiting, ear pain and throat pain since 2 AM after eating [employer] at midnight. he appears ill with tachycardia and dry mucous membranes. Blood sugar greater than 600.  Patient with DKA. Started on IV fluids and IV insulin. PH 7.1. Anion gap 38. Potassium 7.4. Peaked T waves on EKG.  CXR negative.  Hyperkalemia in setting of severe DKA.  EKG  changes evident.  Expect K to rapidly improve with insulin gtt. dw Dr. Rhona Leavens.  Will give calcium and bicarb to temporize K as insulin infuses.  Continue IVF and insulin. ICU admission.  CRITICAL CARE Performed by: Roy Savage Total critical care time: 45 Critical care time was exclusive of separately billable procedures and treating other patients. Critical care was necessary to treat or prevent imminent or life-threatening deterioration. Critical care was time spent personally by me on the following activities: development of treatment plan with patient and/or surrogate as well as nursing, discussions with consultants, evaluation of patient's response to treatment, examination of patient, obtaining history from patient or surrogate, ordering and performing treatments and interventions, ordering and review of laboratory studies, ordering and review of radiographic studies, pulse oximetry and re-evaluation of patient's condition.  I personally performed the services described in this documentation, which was scribed in my presence. The recorded information has been reviewed and is accurate.   Roy Octave, MD 11/05/13 [phone]

## 2013-11-05 NOTE — ED Notes (Addendum)
Pt reports vomiting since 0200 this morning with diarrhea. C/o sore throat, denies fever. Pt diabetic. Took 30 units insulin this am.

## 2013-11-05 NOTE — ED Notes (Signed)
CRITICAL VALUE ALERT  Critical value received: ph- 7.164  Date of notification:  11/05/2013  Time of notification:  1417  Critical value read back:Yes.    Nurse who received alert:  Rancour, MD

## 2013-11-06 LAB — BASIC METABOLIC PANEL
ANION GAP: 15 (ref 5–15)
BUN: 21 mg/dL (ref 6–23)
CHLORIDE: 103 meq/L (ref 96–112)
CO2: 21 meq/L (ref 19–32)
Calcium: 9.5 mg/dL (ref 8.4–10.5)
Creatinine, Ser: 1.03 mg/dL (ref 0.50–1.35)
GFR calc Af Amer: >90 mL/min (ref >90)
GFR calc non Af Amer: >90 mL/min (ref >90)
Glucose, Bld: 187 mg/dL — ABNORMAL HIGH (ref 70–99)
POTASSIUM: 4.5 meq/L (ref 3.7–5.3)
Sodium: 139 mEq/L (ref 137–147)

## 2013-11-06 LAB — GLUCOSE, CAPILLARY
GLUCOSE-CAPILLARY: 125 mg/dL — AB (ref 70–99)
GLUCOSE-CAPILLARY: 168 mg/dL — AB (ref 70–99)
GLUCOSE-CAPILLARY: 183 mg/dL — AB (ref 70–99)
GLUCOSE-CAPILLARY: 169 mg/dL — AB (ref 70–99)
GLUCOSE-CAPILLARY: 141 mg/dL — AB (ref 70–99)
Glucose-Capillary: 105 mg/dL — ABNORMAL HIGH (ref 70–99)
Glucose-Capillary: 128 mg/dL — ABNORMAL HIGH (ref 70–99)
Glucose-Capillary: 131 mg/dL — ABNORMAL HIGH (ref 70–99)
Glucose-Capillary: 137 mg/dL — ABNORMAL HIGH (ref 70–99)
Glucose-Capillary: 152 mg/dL — ABNORMAL HIGH (ref 70–99)
Glucose-Capillary: 187 mg/dL — ABNORMAL HIGH (ref 70–99)
Glucose-Capillary: 190 mg/dL — ABNORMAL HIGH (ref 70–99)
Glucose-Capillary: 245 mg/dL — ABNORMAL HIGH (ref 70–99)

## 2013-11-06 LAB — MRSA PCR SCREENING: MRSA BY PCR: NEGATIVE

## 2013-11-06 LAB — CBC
HCT: 47 % (ref 39.0–52.0)
Hemoglobin: 16.7 g/dL (ref 13.0–17.0)
MCH: 29.2 pg (ref 26.0–34.0)
MCHC: 35.5 g/dL (ref 30.0–36.0)
MCV: 82.3 fL (ref 78.0–100.0)
PLATELETS: 187 10*3/uL (ref 150–400)
RBC: 5.71 MIL/uL (ref 4.22–5.81)
RDW: 12.9 % (ref 11.5–15.5)
WBC: 14.1 10*3/uL — AB (ref 4.0–10.5)

## 2013-11-06 LAB — COMPREHENSIVE METABOLIC PANEL
ALBUMIN: 4.2 g/dL (ref 3.5–5.2)
ALT: 20 U/L (ref 0–53)
AST: 18 U/L (ref 0–37)
Alkaline Phosphatase: 56 U/L (ref 39–117)
Anion gap: 17 — ABNORMAL HIGH (ref 5–15)
BILIRUBIN TOTAL: 1 mg/dL (ref 0.3–1.2)
BUN: 24 mg/dL — ABNORMAL HIGH (ref 6–23)
CHLORIDE: 105 meq/L (ref 96–112)
CO2: 22 mEq/L (ref 19–32)
Calcium: 9.6 mg/dL (ref 8.4–10.5)
Creatinine, Ser: 1.15 mg/dL (ref 0.50–1.35)
GFR calc Af Amer: >90 mL/min (ref >90)
GFR calc non Af Amer: 81 mL/min — ABNORMAL LOW (ref >90)
Glucose, Bld: 163 mg/dL — ABNORMAL HIGH (ref 70–99)
Potassium: 4.5 mEq/L (ref 3.7–5.3)
SODIUM: 144 meq/L (ref 137–147)
Total Protein: 8.1 g/dL (ref 6.0–8.3)

## 2013-11-06 LAB — HEMOGLOBIN A1C
HEMOGLOBIN A1C: 12.7 % — AB (ref <5.7)
MEAN PLASMA GLUCOSE: 318 mg/dL — AB (ref <117)

## 2013-11-06 MED ORDER — INSULIN ASPART 100 UNIT/ML ~~LOC~~ SOLN
0.0000 [IU] | Freq: Three times a day (TID) | SUBCUTANEOUS | Status: DC
  Administered 2013-11-06: 8 [IU] via SUBCUTANEOUS
  Administered 2013-11-07: 11 [IU] via SUBCUTANEOUS
  Administered 2013-11-07: 5 [IU] via SUBCUTANEOUS

## 2013-11-06 MED ORDER — INSULIN ASPART 100 UNIT/ML ~~LOC~~ SOLN
0.0000 [IU] | Freq: Every day | SUBCUTANEOUS | Status: DC
  Administered 2013-11-06: 4 [IU] via SUBCUTANEOUS

## 2013-11-06 MED ORDER — INSULIN GLARGINE 100 UNIT/ML ~~LOC~~ SOLN
35.0000 [IU] | Freq: Once | SUBCUTANEOUS | Status: AC
  Administered 2013-11-06: 35 [IU] via SUBCUTANEOUS
  Filled 2013-11-06: qty 0.35

## 2013-11-06 MED ORDER — INSULIN GLARGINE 100 UNIT/ML ~~LOC~~ SOLN: 35.0000 [IU] | Freq: Every day | SUBCUTANEOUS | Status: DC

## 2013-11-06 MED ORDER — INSULIN ASPART PROT & ASPART (70-30 MIX) 100 UNIT/ML ~~LOC~~ SUSP
22.0000 [IU] | Freq: Two times a day (BID) | SUBCUTANEOUS | Status: DC
  Administered 2013-11-07: 22 [IU] via SUBCUTANEOUS
  Filled 2013-11-06: qty 10

## 2013-11-06 MED ORDER — INSULIN GLARGINE 100 UNIT/ML ~~LOC~~ SOLN
40.0000 [IU] | Freq: Every day | SUBCUTANEOUS | Status: DC
  Filled 2013-11-06 (×3): qty 0.4

## 2013-11-06 NOTE — Progress Notes (Signed)
Inpatient Diabetes Program Recommendations  AACE/ADA: New Consensus Statement on Inpatient Glycemic Control (2013)  Target Ranges:  Prepandial:   less than 140 mg/dL      Peak postprandial:   less than 180 mg/dL (1-2 hours)      Critically ill patients:  140 - 180 mg/dL   Results for Roy Savage, Roy Savage (MRN [id number]) as of 11/06/2013 15:41  Ref. Range 11/06/2013 07:55 11/06/2013 09:01 11/06/2013 09:57 11/06/2013 10:56 11/06/2013 11:57  Glucose-Capillary Latest Range: 70-99 mg/dL 130183 (H) 865190 (H) 784169 (H) 141 (H) 131 (H)   Diabetes history: DM Outpatient Diabetes medications: Lantus 40 units daily Current orders for Inpatient glycemic control:  Lantus 35 units daily, Novolog 0-15 units AC, Novolog 0-5 units HS  Inpatient Diabetes Program Recommendations Insulin - Basal: If basal insulin is changed to more affordable insulin; recommend ordering 70/30 22 units BID (equates to 30 units of basal and 14 units of bolus per day) to start with breakfast on 11/07/13.   Note: Spoke with patient about diabetes and home regimen for diabetes control. Patient reports that he is followed by his PCP (Dr. Sherryll Savage) for diabetes management however he has not seen Dr. Sherryll Savage "in a very long time". Currently he takes Lantus 40 units daily an outpatient for diabetes control. Inquired about who prescribed Lantus and he states that he "has a person he knows that is prescribed Lantus and they share it with him". According to the patient he was diagnosed with diabetes at the age of 33 years old and that he was told he had Type 2 diabetes.  He was released from prison in March of this year and while he was in prison he was being given 2 different kinds of insulin but he is unsure which ones they were. He reports that when he was released in March they gave him some insulin to last awhile and testing supplies which he used until he ran out and then began using the Lantus he acquired from the person he knows.  Inquired about how glucose has  been trending at home and patient reports that he has not checked his glucose since June or July when he ran out of test strips. Informed patient about Reli-On glucometer at [name] for $15 and box of 50 test strips for $9 and encouraged patient to get a glucometer and testing supplies so he can check his glucose and improve his diabetes control. Inquired about prior A1C and patient reports that he does not remember his last A1C. Discussed A1C results (12.7% on 11/05/13) and explained what an A1C is, basic pathophysiology of DM, basic home care, importance of checking CBGs and maintaining good CBG control to prevent long-term and short-term complications. Discussed impact of nutrition, exercise, stress, sickness, and medications on diabetes control.  Encouraged patient to go online and apply for insulin manufacture medication assistance programs to see if he could qualify for free insulin. Also encouraged patient to go to the health department to see if he could get any assistance with getting insulin, to apply for Medicaid, and discussed various resources in the community he could check with to get help with getting insulins.   Patient verbalized understanding of information discussed and he states that he has no further questions at this time related to diabetes.   According to Tammy, RN, CM patient will be provided with a MATCH voucher at time of discharge. Would recommend switching to Novolin 70/30 versus Lantus since Lantus is over $250 per vial and over $450  per box of 5 pens whereas the Novolin 70/30 can be purchased at [place] (only at [place]) for $25 per vial.  Thanks, Orlando Penner, RN, MSN, CCRN Diabetes Coordinator Inpatient Diabetes Program [phone] (Team Pager) [phone] (AP office) [phone] [facility] office)

## 2013-11-06 NOTE — Progress Notes (Signed)
UR chart review completed.  

## 2013-11-06 NOTE — Progress Notes (Signed)
[facility] PROGRESS NOTE  Roy Savage ZOX:[id number] DOB: 08/16/1980 DOA: 11/05/2013 PCP: Kirstie PeriSHAH,ASHISH, MD  Assessment/Plan: 1. DKA  1. Pt is currently cont on insulin gtt 2. Glucose is improving 3. GAP of 17 this AM 4. Consider transitioning to subQ insulin soon 5. A1c of 12.7 6. Diabetic coordinator consulted 2. Hyperkalemia  1. Calcium and bicarb given in ED 2. Peaked T waves noted on EKG 3. K has normalized 3. N/V  1. Likely secondary to above 2. Cont supportive care 4. DVT prophylaxis  1. Heparin subQ 5. Metabolic acidosis  1. Likely secondary to profound DKA per above 2. Pt given one amp of bicarb in the ED 3. Improving with GAP of 17  Code Status: Full Family Communication: Pt in room Disposition Plan: Pending   Consultants:    Procedures:    Antibiotics:    HPI/Subjective: No acute events noted overnight  Objective: Filed Vitals:   11/06/13 0500 11/06/13 0600 11/06/13 0700 11/06/13 0744  BP: 132/82 132/81 131/83   Pulse: 82 94 87 80  Temp:      TempSrc:      Resp: 15 16 15 15   Height:      Weight: 75.6 kg (166 lb 10.7 oz)     SpO2: 97% 97% 98% 97%    Intake/Output Summary (Last 24 hours) at 11/06/13 0826 Last data filed at 11/06/13 0800  Gross per 24 hour  Intake 1343.61 ml  Output    600 ml  Net 743.61 ml   Filed Weights   11/05/13 1239 11/06/13 0500  Weight: 83.915 kg (185 lb) 75.6 kg (166 lb 10.7 oz)    Exam:   General:  Awake, in nad  Cardiovascular: regular, s1, s2  Respiratory: normal resp effort, no wheezing  Abdomen: soft, nondistended  Musculoskeletal: perfused, no clubbing   Data Reviewed: Basic Metabolic Panel:  Recent Labs Lab 11/05/13 1541 11/05/13 1723 11/05/13 1931 11/05/13 2104 11/06/13 0409  NA 137 141 139 139 144  K 6.9* 5.4* 5.1 4.9 4.5  CL 91* 96 98 99 105  CO2 11* 13* 15* 19 22  GLUCOSE 629* 494* 355* 262* 163*  BUN 33* 33* 30* 27* 24*  CREATININE 1.44* 1.49* 1.41* 1.26 1.15   CALCIUM 10.3 10.5 10.3 10.0 9.6   Liver Function Tests:  Recent Labs Lab 11/06/13 0409  AST 18  ALT 20  ALKPHOS 56  BILITOT 1.0  PROT 8.1  ALBUMIN 4.2   No results found for this basename: LIPASE, AMYLASE,  in the last 168 hours No results found for this basename: AMMONIA,  in the last 168 hours CBC:  Recent Labs Lab 11/05/13 1346 11/05/13 1541 11/06/13 0409  WBC 17.9* 18.7* 14.1*  NEUTROABS 16.0*  --   --   HGB 17.8* 17.4* 16.7  HCT 51.7 49.4 47.0  MCV 85.5 84.4 82.3  PLT 192 191 187   Cardiac Enzymes: No results found for this basename: CKTOTAL, CKMB, CKMBINDEX, TROPONINI,  in the last 168 hours BNP (last 3 results) No results found for this basename: PROBNP,  in the last 8760 hours CBG:  Recent Labs Lab 11/06/13 0322 11/06/13 0413 11/06/13 0527 11/06/13 0655 11/06/13 0755  GLUCAP 137* 168* 152* 187* 183*    Recent Results (from the past 240 hour(s))  RAPID STREP SCREEN     Status: None   Collection Time    11/05/13  2:01 PM      Result Value Ref Range Status   Streptococcus, Group A Screen (Direct)  NEGATIVE  NEGATIVE Final   Comment: (NOTE)     A Rapid Antigen test may result negative if the antigen level in the     sample is below the detection level of this test. The FDA has not     cleared this test as a stand-alone test therefore the rapid antigen     negative result has reflexed to a Group A Strep culture.  MRSA PCR SCREENING     Status: None   Collection Time    11/05/13  7:00 PM      Result Value Ref Range Status   MRSA by PCR NEGATIVE  NEGATIVE Final   Comment:            The GeneXpert MRSA Assay (FDA     approved for NASAL specimens     only), is one component of a     comprehensive MRSA colonization     surveillance program. It is not     intended to diagnose MRSA     infection nor to guide or     monitor treatment for     MRSA infections.     Studies: Dg Chest 2 View  11/05/2013   CLINICAL DATA:  Nausea, vomiting.  EXAM:  CHEST  2 VIEW  COMPARISON:  December 24, 2012.  FINDINGS: The heart size and mediastinal contours are within normal limits. Both lungs are clear. No pneumothorax or pleural effusion is noted. The visualized skeletal structures are unremarkable.  IMPRESSION: No acute cardiopulmonary abnormality seen.   Electronically Signed   By: Roque Lias M.D.   On: 11/05/2013 14:38    Scheduled Meds: . sodium chloride   Intravenous STAT  . heparin  5,000 Units Subcutaneous 3 times per day  . Influenza vac split quadrivalent PF  0.5 mL Intramuscular Tomorrow-1000  . pneumococcal 23 valent vaccine  0.5 mL Intramuscular Tomorrow-1000  . sodium chloride  3 mL Intravenous Q12H   Continuous Infusions: . sodium chloride 1,000 mL (11/05/13 1516)  . sodium chloride Stopped (11/06/13 0041)  . dextrose 5 % and 0.45% NaCl 100 mL/hr at 11/06/13 0800  . insulin (NOVOLIN-R) infusion 3.7 mL/hr at 11/06/13 0800    Active Problems:   DKA (diabetic ketoacidoses)  Time spent:  [name] K  [facility] Pager [phone]. If 7PM-7AM, please contact night-coverage at [email or web address], password [facility] 11/06/2013, 8:26 AM  LOS: 1 day

## 2013-11-06 NOTE — Progress Notes (Signed)
Patient being transferred to med surg. Called report to Sempra EnergyJanet RN on 300. Patient is awake and oriented calm and is off all drips. Patient will be able to be transferred by wheelchair.

## 2013-11-06 NOTE — Care Management Note (Signed)
    Page 1 of 1   11/06/2013     2:39:25 PM CARE MANAGEMENT NOTE 11/06/2013  Patient:  Janey GreaserCARTER,Tami W   Account Number:  [id number]  Date Initiated:  11/06/2013  Documentation initiated by:  Sharrie RothmanBLACKWELL,Nakyiah Kuck C  Subjective/Objective Assessment:   Pt admitted from home with DKA. Pt lives with his grandmother and will return home at discharge. Pt is independent with ADL's. Pt has a glucometer for home use.     Action/Plan:   Pt PCP is Dr. Sherryll BurgerSHah. MATCH voucher given to pt for medication assistance.   Anticipated DC Date:  11/08/2013   Anticipated DC Plan:  HOME/SELF CARE      DC Planning Services  CM consult  MATCH Program      Choice offered to / List presented to:             Status of service:  Completed, signed off Medicare Important Message given?   (If response is "NO", the following Medicare IM given date fields will be blank) Date Medicare IM given:   Medicare IM given by:   Date Additional Medicare IM given:   Additional Medicare IM given by:    Discharge Disposition:  HOME/SELF CARE  Per UR Regulation:    If discussed at Long Length of Stay Meetings, dates discussed:    Comments:  11/06/13 1440 Arlyss Queenammy Dvaughn Fickle, RN BSN CM

## 2013-11-07 LAB — GLUCOSE, CAPILLARY
GLUCOSE-CAPILLARY: 337 mg/dL — AB (ref 70–99)
Glucose-Capillary: 314 mg/dL — ABNORMAL HIGH (ref 70–99)
Glucose-Capillary: 211 mg/dL — ABNORMAL HIGH (ref 70–99)

## 2013-11-07 LAB — CULTURE, GROUP A STREP

## 2013-11-07 MED ORDER — INSULIN NPH ISOPHANE & REGULAR (70-30) 100 UNIT/ML ~~LOC~~ SUSP: 25.0000 [IU] | Freq: Two times a day (BID) | SUBCUTANEOUS | Status: DC

## 2013-11-07 MED ORDER — INSULIN ASPART PROT & ASPART (70-30 MIX) 100 UNIT/ML ~~LOC~~ SUSP: 25.0000 [IU] | Freq: Two times a day (BID) | SUBCUTANEOUS | Status: DC

## 2013-11-07 MED ORDER — FREESTYLE LANCETS MISC
Status: DC
Start: 2013-11-07 — End: 2018-02-19

## 2013-11-07 MED ORDER — "INSULIN SYRINGE 31G X 5/16"" 0.5 ML MISC": 1.0000 | Freq: Two times a day (BID) | Status: DC

## 2013-11-07 NOTE — Plan of Care (Signed)
Problem: Discharge Progression Outcomes Goal: CBGs less than or equal to 180 on planned home regimen Outcome: Progressing On home regimen Goal: Obtain signed CBG meter Rx form Outcome: Completed/Met Date Met:  11/07/13 Pt told to go to [place] for glucose meter See diabetes nurse note Goal: Patient states knowledge of home Diabetes Mellitus meds Outcome: Completed/Met Date Met:  11/07/13  the patient encouraged to use community resources for diabetes needs and to follow up with his PCP

## 2013-11-07 NOTE — Discharge Summary (Signed)
Physician Discharge Summary  Roy Savage AVW:[id number] DOB: 02-16-1980 DOA: 11/05/2013  PCP: Kirstie Peri, MD  Admit date: 11/05/2013 Discharge date: 11/07/2013  Time spent: 35 minutes  Recommendations for Outpatient Follow-up:  1. Follow up with PCP in 1-2 weeks  Discharge Diagnoses:  Active Problems:   DKA (diabetic ketoacidoses) Diabetes, likely type 2  Discharge Condition: Improved  Diet recommendation: Diabetic  Filed Weights   11/05/13 1239 11/06/13 0500  Weight: 83.915 kg (185 lb) 75.6 kg (166 lb 10.7 oz)    History of present illness:  Please see admit h and p from 10/2 for details. Briefly, pt presents with florid DKA after florid dietary and medication noncompliance over the past several months prior to admit. Pt was admitted for further work up.  Hospital Course:  1. DKA  1. Pt initially required insulin gtt which was later transitioned to subq Lantus 2. A1c of 12.7 3. Diabetic coordinator consulted with recommendations for 70/30 Novolin 22 units BID 4. The patient's glucose remained in the 300 range, so he will be discharged with 25units BID, with instructions to follow up with his PCP in one week. Pt states he will comply. 2. Hyperkalemia  1. Calcium and bicarb given in ED 2. Peaked T waves noted on EKG 3. K has since normalized 3. N/V  1. Likely secondary to above 2. Resolved 4. DVT prophylaxis  1. Heparin subQ while inpatient 5. Metabolic acidosis  1. Likely secondary to profound DKA per above 2. Pt given one amp of bicarb in the ED 3. Improving with last measured GAP of 15 (from 38 on admit)  Consultations:  Diabetic coordinator  Discharge Exam: Filed Vitals:   11/06/13 1700 11/06/13 1800 11/06/13 2300 11/07/13 0500  BP: 133/84 135/81 122/66 130/67  Pulse: 62 88 79 88  Temp:   99.2 F (37.3 C) 99.4 F (37.4 C)  TempSrc:   Oral Oral  Resp: 21 18 19 19   Height:      Weight:      SpO2: 98% 97% 98% 97%    General: awake, in  nad Cardiovascular: regular, s1, s2 Respiratory: normal resp effort, no wheezing  Discharge Instructions      Discharge Instructions   For home use only DME Glucometer    Complete by:  As directed             Medication List    STOP taking these medications       insulin glargine 100 UNIT/ML injection  Commonly known as:  LANTUS      TAKE these medications       freestyle lancets  Use as instructed     insulin NPH-regular Human (70-30) 100 UNIT/ML injection  Commonly known as:  NOVOLIN 70/30  Inject 25 Units into the skin 2 (two) times daily with a meal.     INSULIN SYRINGE .5CC/31GX5/16" 31G X 5/16" 0.5 ML Misc  1 Device by Does not apply route 2 (two) times daily.       Allergies  Allergen Reactions  . Shellfish Allergy Hives and Rash   Follow-up Information   Follow up with [facility] Thompson Peak, MD. Schedule an appointment as soon as possible for a visit in 1 week.   Specialty:  Internal Medicine   Contact information:   [address]  [place] [address] [phone]        The results of significant diagnostics from this hospitalization (including imaging, microbiology, ancillary and laboratory) are listed below for reference.    Significant Diagnostic  Studies: Dg Chest 2 View  11/05/2013   CLINICAL DATA:  Nausea, vomiting.  EXAM: CHEST  2 VIEW  COMPARISON:  December 24, 2012.  FINDINGS: The heart size and mediastinal contours are within normal limits. Both lungs are clear. No pneumothorax or pleural effusion is noted. The visualized skeletal structures are unremarkable.  IMPRESSION: No acute cardiopulmonary abnormality seen.   Electronically Signed   By: Roque Lias M.D.   On: 11/05/2013 14:38    Microbiology: Recent Results (from the past 240 hour(s))  CULTURE, GROUP A STREP     Status: None   Collection Time    11/05/13  2:00 PM      Result Value Ref Range Status   Specimen Description THROAT   Final   Special Requests NONE   Final   Culture     Final    Value: NO SUSPICIOUS COLONIES, CONTINUING TO HOLD     Performed at [facility]   Report Status PENDING   Incomplete  RAPID STREP SCREEN     Status: None   Collection Time    11/05/13  2:01 PM      Result Value Ref Range Status   Streptococcus, Group A Screen (Direct) NEGATIVE  NEGATIVE Final   Comment: (NOTE)     A Rapid Antigen test may result negative if the antigen level in the     sample is below the detection level of this test. The FDA has not     cleared this test as a stand-alone test therefore the rapid antigen     negative result has reflexed to a Group A Strep culture.  MRSA PCR SCREENING     Status: None   Collection Time    11/05/13  7:00 PM      Result Value Ref Range Status   MRSA by PCR NEGATIVE  NEGATIVE Final   Comment:            The GeneXpert MRSA Assay (FDA     approved for NASAL specimens     only), is one component of a     comprehensive MRSA colonization     surveillance program. It is not     intended to diagnose MRSA     infection nor to guide or     monitor treatment for     MRSA infections.     Labs: Basic Metabolic Panel:  Recent Labs Lab 11/05/13 1723 11/05/13 1931 11/05/13 2104 11/06/13 0409 11/06/13 0931  NA 141 139 139 144 139  K 5.4* 5.1 4.9 4.5 4.5  CL 96 98 99 105 103  CO2 13* 15* 19 22 21   GLUCOSE 494* 355* 262* 163* 187*  BUN 33* 30* 27* 24* 21  CREATININE 1.49* 1.41* 1.26 1.15 1.03  CALCIUM 10.5 10.3 10.0 9.6 9.5   Liver Function Tests:  Recent Labs Lab 11/06/13 0409  AST 18  ALT 20  ALKPHOS 56  BILITOT 1.0  PROT 8.1  ALBUMIN 4.2   No results found for this basename: LIPASE, AMYLASE,  in the last 168 hours No results found for this basename: AMMONIA,  in the last 168 hours CBC:  Recent Labs Lab 11/05/13 1346 11/05/13 1541 11/06/13 0409  WBC 17.9* 18.7* 14.1*  NEUTROABS 16.0*  --   --   HGB 17.8* 17.4* 16.7  HCT 51.7 49.4 47.0  MCV 85.5 84.4 82.3  PLT 192 191 187   Cardiac Enzymes: No  results found for this basename: CKTOTAL, CKMB, CKMBINDEX, TROPONINI,  in the last 168 hours BNP: BNP (last 3 results) No results found for this basename: PROBNP,  in the last 8760 hours CBG:  Recent Labs Lab 11/06/13 1157 11/06/13 1623 11/06/13 2047 11/07/13 0803 11/07/13 1121  GLUCAP 131* 245* 337* 314* 211*    Signed:  CHIU, STEPHEN K  [facility] 11/07/2013, 11:36 AM
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
## 2015-07-31 ENCOUNTER — Encounter (HOSPITAL_COMMUNITY): Payer: Self-pay | Admitting: *Deleted

## 2015-07-31 ENCOUNTER — Emergency Department (HOSPITAL_COMMUNITY)
Admission: EM | Admit: 2015-07-31 | Discharge: 2015-07-31 | Disposition: A | Discharge status: Home / Self Care | Payer: MEDICAID | Attending: Emergency Medicine | Admitting: Emergency Medicine

## 2015-07-31 DIAGNOSIS — Y9389 Activity, other specified: Secondary | ICD-10-CM | POA: Insufficient documentation

## 2015-07-31 DIAGNOSIS — S90861A Insect bite (nonvenomous), right foot, initial encounter: Secondary | ICD-10-CM | POA: Insufficient documentation

## 2015-07-31 DIAGNOSIS — Z794 Long term (current) use of insulin: Secondary | ICD-10-CM | POA: Insufficient documentation

## 2015-07-31 DIAGNOSIS — E119 Type 2 diabetes mellitus without complications: Secondary | ICD-10-CM | POA: Insufficient documentation

## 2015-07-31 DIAGNOSIS — Y929 Unspecified place or not applicable: Secondary | ICD-10-CM | POA: Insufficient documentation

## 2015-07-31 DIAGNOSIS — W57XXXA Bitten or stung by nonvenomous insect and other nonvenomous arthropods, initial encounter: Secondary | ICD-10-CM | POA: Insufficient documentation

## 2015-07-31 DIAGNOSIS — S30860A Insect bite (nonvenomous) of lower back and pelvis, initial encounter: Secondary | ICD-10-CM | POA: Insufficient documentation

## 2015-07-31 DIAGNOSIS — S90862A Insect bite (nonvenomous), left foot, initial encounter: Secondary | ICD-10-CM | POA: Insufficient documentation

## 2015-07-31 DIAGNOSIS — Y999 Unspecified external cause status: Secondary | ICD-10-CM | POA: Insufficient documentation

## 2015-07-31 MED ORDER — DOXYCYCLINE HYCLATE 100 MG PO TABS
100.0000 mg | ORAL_TABLET | Freq: Once | ORAL | Status: AC
  Administered 2015-07-31: 100 mg via ORAL
  Filled 2015-07-31: qty 1

## 2015-07-31 MED ORDER — CLINDAMYCIN HCL 150 MG PO CAPS: ORAL_CAPSULE | ORAL | Status: DC

## 2015-07-31 MED ORDER — HYDROXYZINE PAMOATE 25 MG PO CAPS: 25.0000 mg | ORAL_CAPSULE | Freq: Four times a day (QID) | ORAL | Status: DC | PRN

## 2015-07-31 NOTE — Discharge Instructions (Signed)
Tried very hard not to scratch the bite areas on your feet or buttocks. Please use to clindamycin 2 times daily with food. Use Vistaril every 6 hours as needed for itching. This medication may cause drowsiness, please do not drink alcohol, operate machinery, drive a vehicle, or dissipated activities requiring concentration when taking this medication. Please use Tinactin, or Lamisil powder to your feet, as your exam show some early athlete's foot changes. Because your feet are involved, and you have a history of diabetes, please see Dr.Shah later this week for follow-up and recheck.

## 2015-07-31 NOTE — ED Notes (Signed)
Pt c/o rash/insect bites to bilateral feet and buttock region after cleaning out his dog kennel on 6 days ago,

## 2015-07-31 NOTE — ED Provider Notes (Signed)
CSN: [id number]     Arrival date & time 07/31/15  1926 History   First MD Initiated Contact with Patient 07/31/15 2052     Chief Complaint  Patient presents with  . Rash     (Consider location/radiation/quality/duration/timing/severity/associated sxs/prior Treatment) HPI Comments: Patient is a 35 year old male who presents to the emergency department with a complaint of a rash, and/or insect bites.  The patient states that about 6 or 7 days ago he was cleaning out his dog kennel. Following this he has noticed a rash or bites on both of his feet, and his buttocks. The patient denies drainage from the areas. He complains of itching. He's not had red streaks in the areas. It is of note that the patient is diabetic. He states that he took his shoes off when he went to the dog kennel. He also states that he went fishing recently without boots.  Patient is a 35 y.o. male presenting with rash. The history is provided by the patient.  Rash Associated symptoms: no abdominal pain, no joint pain, no shortness of breath and not wheezing     Past Medical History  Diagnosis Date  . Diabetes mellitus without complication (HCC)    History reviewed. No pertinent past surgical history. No family history on file. Social History  Substance Use Topics  . Smoking status: Never Smoker   . Smokeless tobacco: None  . Alcohol Use: No    Review of Systems  Constitutional: Negative for activity change.       All ROS Neg except as noted in HPI  HENT: Negative for nosebleeds.   Eyes: Negative for photophobia and discharge.  Respiratory: Negative for cough, shortness of breath and wheezing.   Cardiovascular: Negative for chest pain and palpitations.  Gastrointestinal: Negative for abdominal pain and blood in stool.  Genitourinary: Negative for dysuria, frequency and hematuria.  Musculoskeletal: Negative for back pain, arthralgias and neck pain.  Skin: Positive for rash.  Neurological: Negative for  dizziness, seizures and speech difficulty.  Psychiatric/Behavioral: Negative for hallucinations and confusion.      Allergies  Shellfish allergy  Home Medications   Prior to Admission medications   Medication Sig Start Date End Date Taking? Authorizing Provider  insulin NPH-regular Human (NOVOLIN 70/30) (70-30) 100 UNIT/ML injection Inject 25 Units into the skin 2 (two) times daily with a meal. 11/07/13   Jerald KiefStephen K Chiu, MD  Insulin Syringe-Needle U-100 (INSULIN SYRINGE .5CC/31GX5/16") 31G X 5/16" 0.5 ML MISC 1 Device by Does not apply route 2 (two) times daily. 11/07/13   Jerald KiefStephen K Chiu, MD  Lancets (FREESTYLE) lancets Use as instructed 11/07/13   Jerald KiefStephen K Chiu, MD   BP 138/78 mmHg  Pulse 64  Temp(Src) 98.5 F (36.9 C) (Oral)  Resp 20  Ht 5\' 10"  (1.778 m)  Wt 86.183 kg  BMI 27.26 kg/m2  SpO2 96% Physical Exam  Constitutional: He is oriented to person, place, and time. He appears well-developed and well-nourished.  Non-toxic appearance.  HENT:  Head: Normocephalic.  Right Ear: Tympanic membrane and external ear normal.  Left Ear: Tympanic membrane and external ear normal.  Eyes: EOM and lids are normal. Pupils are equal, round, and reactive to light.  Neck: Normal range of motion. Neck supple. Carotid bruit is not present.  Cardiovascular: Normal rate, regular rhythm, normal heart sounds, intact distal pulses and normal pulses.   Pulmonary/Chest: Breath sounds normal. No respiratory distress.  Abdominal: Soft. Bowel sounds are normal. There is no tenderness. There is no  guarding.  Musculoskeletal: Normal range of motion.  Lymphadenopathy:       Head (right side): No submandibular adenopathy present.       Head (left side): No submandibular adenopathy present.    He has no cervical adenopathy.  Neurological: He is alert and oriented to person, place, and time. He has normal strength. No cranial nerve deficit or sensory deficit.  Skin: Skin is warm and dry.  Multiple red  raised areas of the dorsum and lateral portion of the right and left feet. Mild redness around the raised red areas. No red streaking appreciated. No streaking appreciated. There is also some dry scaling areas in between the toes, consistent with some early athlete's foot changes. There no lesions noted of the plantar surface.  Psychiatric: He has a normal mood and affect. His speech is normal.  Nursing note and vitals reviewed.   ED Course  Procedures (including critical care time) Labs Review Labs Reviewed - No data to display  Imaging Review No results found. I have personally reviewed and evaluated these images and lab results as part of my medical decision-making.   EKG Interpretation None      MDM  Vital signs within normal limits. The examination favors insect bites on present. The patient will be treated with clindamycin and Vistaril for itching. I discussed with the patient the importance of trying not to scratch the areas, particularly since he has a history of diabetes. The patient is to follow-up with his primary physician in 3 or 4 days. He will return to the emergency department if any changes, problems, or concerns.    Final diagnoses:  Insect bites    *I have reviewed nursing notes, vital signs, and all appropriate lab and imaging results for this patient.Ivery Quale*    Camiya Vinal, PA-C 07/31/15 2121  Vanetta MuldersScott Zackowski, MD 08/03/15 [phone]

## 2015-12-08 ENCOUNTER — Encounter (HOSPITAL_COMMUNITY): Payer: Self-pay | Admitting: Emergency Medicine

## 2015-12-08 ENCOUNTER — Emergency Department (HOSPITAL_COMMUNITY)
Admission: EM | Admit: 2015-12-08 | Discharge: 2015-12-08 | Disposition: A | Discharge status: Home / Self Care | Payer: No Typology Code available for payment source | Attending: Emergency Medicine | Admitting: Emergency Medicine

## 2015-12-08 DIAGNOSIS — Z792 Long term (current) use of antibiotics: Secondary | ICD-10-CM | POA: Insufficient documentation

## 2015-12-08 DIAGNOSIS — Y9389 Activity, other specified: Secondary | ICD-10-CM | POA: Diagnosis not present

## 2015-12-08 DIAGNOSIS — Y999 Unspecified external cause status: Secondary | ICD-10-CM | POA: Insufficient documentation

## 2015-12-08 DIAGNOSIS — S199XXA Unspecified injury of neck, initial encounter: Secondary | ICD-10-CM | POA: Diagnosis present

## 2015-12-08 DIAGNOSIS — Y92410 Unspecified street and highway as the place of occurrence of the external cause: Secondary | ICD-10-CM | POA: Insufficient documentation

## 2015-12-08 DIAGNOSIS — M791 Myalgia: Secondary | ICD-10-CM | POA: Insufficient documentation

## 2015-12-08 DIAGNOSIS — M7918 Myalgia, other site: Secondary | ICD-10-CM

## 2015-12-08 MED ORDER — NAPROXEN 500 MG PO TABS: 500.0000 mg | ORAL_TABLET | Freq: Two times a day (BID) | ORAL | 0 refills | Status: DC

## 2015-12-08 MED ORDER — CYCLOBENZAPRINE HCL 10 MG PO TABS: 10.0000 mg | ORAL_TABLET | Freq: Two times a day (BID) | ORAL | 0 refills | Status: DC | PRN
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
## 2016-01-02 ENCOUNTER — Encounter (HOSPITAL_COMMUNITY): Payer: Self-pay | Admitting: Emergency Medicine

## 2016-01-02 ENCOUNTER — Emergency Department (HOSPITAL_COMMUNITY)
Admission: EM | Admit: 2016-01-02 | Discharge: 2016-01-02 | Disposition: A | Discharge status: Home / Self Care | Payer: Self-pay | Attending: Emergency Medicine | Admitting: Emergency Medicine

## 2016-01-02 DIAGNOSIS — T464X5A Adverse effect of angiotensin-converting-enzyme inhibitors, initial encounter: Secondary | ICD-10-CM | POA: Insufficient documentation

## 2016-01-02 DIAGNOSIS — T783XXA Angioneurotic edema, initial encounter: Secondary | ICD-10-CM | POA: Insufficient documentation

## 2016-01-02 DIAGNOSIS — Z794 Long term (current) use of insulin: Secondary | ICD-10-CM | POA: Insufficient documentation

## 2016-01-02 DIAGNOSIS — E119 Type 2 diabetes mellitus without complications: Secondary | ICD-10-CM | POA: Insufficient documentation

## 2016-01-02 NOTE — ED Notes (Signed)
Patient states he noticed swelling to upper lip last night. States he has taken an OTC medication for recent illness and takes lisinopril "for a few months." States "I think I may be allergic to that lisinopril because I notice it makes me cough." Patient denies pain, difficulty breathing, or difficulty swallowing.

## 2016-01-02 NOTE — Discharge Instructions (Signed)
Stop the lisinopril.  Call your doctor to arrange a follow-up appt.  Return to ER for any worsening symptoms such as increased swelling or your lips, face or if you develop swelling of your tongue.

## 2016-01-02 NOTE — ED Notes (Signed)
Patient drank sprite with no difficulty.

## 2016-01-02 NOTE — ED Triage Notes (Signed)
Pt states he woke this morning with lip swelling. Pt states his lip has gone down now. NAD noted. Pt denies SOB or difficulty breathing. Pt states he has had a cold.

## 2016-01-02 NOTE — ED Notes (Signed)
Gave patient sprite to drink for fluid challenge. 

## 2016-01-02 NOTE — ED Provider Notes (Signed)
[facility] Provider Note   CSN: [id number] Arrival date & time: 01/02/16  1241  By signing my name below, I, Roy Savage, attest that this documentation has been prepared under the direction and in the presence of Lamount Bankson, PA-C. Electronically Signed: Angelene GiovanniEmmanuella Savage, ED Scribe. 01/02/16. 2:04 PM.   History   Chief Complaint Chief Complaint  Patient presents with  . Allergic Reaction    HPI Comments: Roy Savage is a 35 y.o. male with a hx of DM who presents to the Emergency Department for evaluation for possible allergic reaction occurring while he was asleep today. He explains that he woke up this morning with upper lip swelling but his swelling is gradually improving. He adds that he has been experiencing URI symptoms for several days and has been treating his symptoms with Mucinex. No alleviating factors noted. Pt has not tried any medications for his lip swelling. He reports that his son's upper lip was also swollen 5 days ago but they have not been in contact for long periods of time; only meeting at a barbershop once last week. He denies any new foods, lotions, or medications. He states that he has been on Lisinopril for 6 months for kidney protection; he took a dose last night and this morning.  He denies any fever, itching, rash, chest pain, SOB, tongue/throat swelling, sore throat, trouble swallowing, or any other symptoms.   The history is provided by the patient. No language interpreter was used.  Allergic Reaction  Presenting symptoms: swelling   Presenting symptoms: no difficulty swallowing, no itching, no rash and no wheezing   Severity:  Moderate Prior allergic episodes:  No prior episodes Context: medications   Relieved by:  None tried Worsened by:  Nothing Ineffective treatments:  None tried   Past Medical History:  Diagnosis Date  . Diabetes mellitus without complication [facility](HCC)     Patient Active Problem List   Diagnosis Date Noted  .  DKA (diabetic ketoacidoses) (HCC) 11/05/2013    History reviewed. No pertinent surgical history.     Home Medications    Prior to Admission medications   Medication Sig Start Date End Date Taking? Authorizing Provider  clindamycin (CLEOCIN) 150 MG capsule 2 po bid with food 07/31/15   Ivery QualeHobson Bryant, PA-C  cyclobenzaprine (FLEXERIL) 10 MG tablet Take 1 tablet (10 mg total) by mouth 2 (two) times daily as needed for muscle spasms. 12/08/15   Elpidio AnisShari Upstill, PA-C  hydrOXYzine (VISTARIL) 25 MG capsule Take 1 capsule (25 mg total) by mouth every 6 (six) hours as needed for itching. 07/31/15   Ivery QualeHobson Bryant, PA-C  insulin NPH-regular Human (NOVOLIN 70/30) (70-30) 100 UNIT/ML injection Inject 25 Units into the skin 2 (two) times daily with a meal. 11/07/13   Jerald KiefStephen K Chiu, MD  Insulin Syringe-Needle U-100 (INSULIN SYRINGE .5CC/31GX5/16") 31G X 5/16" 0.5 ML MISC 1 Device by Does not apply route 2 (two) times daily. 11/07/13   Jerald KiefStephen K Chiu, MD  Lancets (FREESTYLE) lancets Use as instructed 11/07/13   Jerald KiefStephen K Chiu, MD  naproxen (NAPROSYN) 500 MG tablet Take 1 tablet (500 mg total) by mouth 2 (two) times daily. 12/08/15   Elpidio AnisShari Upstill, PA-C    Family History Family History  Problem Relation Age of Onset  . Diabetes Other     Social History Social History  Substance Use Topics  . Smoking status: Never Smoker  . Smokeless tobacco: Never Used  . Alcohol use No     Allergies  Shellfish allergy   Review of Systems Review of Systems  Constitutional: Negative for fever.  HENT: Positive for facial swelling (upper lip). Negative for mouth sores, sore throat and trouble swallowing.   Respiratory: Negative for shortness of breath and wheezing.   Gastrointestinal: Negative for nausea and vomiting.  Skin: Negative for itching, rash and wound.  Neurological: Negative for weakness, numbness and headaches.     Physical Exam Updated Vital Signs BP 143/89 (BP Location: Left Arm)   Pulse 82    Temp 98.7 F (37.1 C) (Oral)   Resp 18   Ht 5\' 11"  (1.803 m)   Wt 180 lb (81.6 kg)   SpO2 99%   BMI 25.10 kg/m   Physical Exam  Constitutional: He is oriented to person, place, and time. He appears well-developed and well-nourished.  HENT:  Head: Normocephalic and atraumatic.  Mouth/Throat: Uvula is midline, oropharynx is clear and moist and mucous membranes are normal. No oral lesions. No trismus in the jaw. No uvula swelling. No posterior oropharyngeal edema or posterior oropharyngeal erythema.  Focal edema of the upper lip with left side greater than right; uvula midline without edema; no edema of the tonge  Eyes: EOM are normal. Pupils are equal, round, and reactive to light.  Neck: Normal range of motion. No JVD present.  Cardiovascular: Normal rate, regular rhythm and intact distal pulses.   Pulmonary/Chest: Effort normal. No stridor. No respiratory distress.  Musculoskeletal: Normal range of motion.  Lymphadenopathy:    He has no cervical adenopathy.  Neurological: He is alert and oriented to person, place, and time.  Skin: Skin is warm and dry. No rash noted.  Psychiatric: He has a normal mood and affect.  Nursing note and vitals reviewed.    ED Treatments / Results  DIAGNOSTIC STUDIES: Oxygen Saturation is 99% on RA, normal by my interpretation.    COORDINATION OF CARE: 1:30 PM- Pt advised of plan for treatment and pt agrees. Pt will be monitored here in the ED.   Labs (all labs ordered are listed, but only abnormal results are displayed) Labs Reviewed - No data to display  EKG  EKG Interpretation None       Radiology No results found.  Procedures Procedures (including critical care time)  Medications Ordered in ED Medications - No data to display   Initial Impression / Assessment and Plan / ED Course  Marlen Koman, PA-C has reviewed the triage vital signs and the nursing notes.  Pertinent labs & imaging results that were available during my care  of the patient were reviewed by me and considered in my medical decision making (see chart for details).  Clinical Course     Pt well appearing, no respiratory distress.  Pt has been observed in the dept w/o worsening symptoms.  Edema likely result of ACE inhibitor.  Pt advised to d/c use and close f/u with his PMD.  Strict return precautions given.    Final Clinical Impressions(s) / ED Diagnoses   Final diagnoses:  ACE inhibitor-aggravated angioedema, initial encounter    New Prescriptions New Prescriptions   No medications on file   I personally performed the services described in this documentation, which was scribed in my presence. The recorded information has been reviewed and is accurate.    Pauline Ausammy Naylah Cork, PA-C 01/05/16 1725    Blane OharaJoshua Zavitz, MD 01/07/16 [phone]

## 2016-01-06 ENCOUNTER — Emergency Department (HOSPITAL_COMMUNITY)
Admission: EM | Admit: 2016-01-06 | Discharge: 2016-01-06 | Disposition: A | Discharge status: Home / Self Care | Payer: Self-pay | Attending: Emergency Medicine | Admitting: Emergency Medicine

## 2016-01-06 DIAGNOSIS — L509 Urticaria, unspecified: Secondary | ICD-10-CM | POA: Insufficient documentation

## 2016-01-06 DIAGNOSIS — Z794 Long term (current) use of insulin: Secondary | ICD-10-CM | POA: Insufficient documentation

## 2016-01-06 DIAGNOSIS — R739 Hyperglycemia, unspecified: Secondary | ICD-10-CM

## 2016-01-06 DIAGNOSIS — E1165 Type 2 diabetes mellitus with hyperglycemia: Secondary | ICD-10-CM | POA: Insufficient documentation

## 2016-01-06 LAB — CBG MONITORING, ED: GLUCOSE-CAPILLARY: 382 mg/dL — AB (ref 65–99)

## 2016-01-06 MED ORDER — PREDNISONE 50 MG PO TABS
60.0000 mg | ORAL_TABLET | Freq: Once | ORAL | Status: AC
  Administered 2016-01-06: 60 mg via ORAL
  Filled 2016-01-06: qty 1

## 2016-01-06 MED ORDER — PREDNISONE 50 MG PO TABS: 50.0000 mg | ORAL_TABLET | Freq: Every day | ORAL | 0 refills | Status: DC

## 2016-01-06 MED ORDER — PREDNISONE 20 MG PO TABS: ORAL_TABLET | ORAL | 0 refills | Status: DC

## 2016-01-06 MED ORDER — FAMOTIDINE 20 MG PO TABS: 20.0000 mg | ORAL_TABLET | Freq: Two times a day (BID) | ORAL | 0 refills | Status: DC

## 2016-01-06 MED ORDER — FAMOTIDINE 20 MG PO TABS
20.0000 mg | ORAL_TABLET | Freq: Once | ORAL | Status: AC
  Administered 2016-01-06: 20 mg via ORAL
  Filled 2016-01-06: qty 1

## 2016-01-06 MED ORDER — DIPHENHYDRAMINE HCL 25 MG PO CAPS
50.0000 mg | ORAL_CAPSULE | Freq: Once | ORAL | Status: AC
  Administered 2016-01-06: 50 mg via ORAL
  Filled 2016-01-06: qty 2

## 2016-01-06 NOTE — ED Provider Notes (Signed)
[facility]-EMERGENCY DEPT Provider Note   CSN: [id number] Arrival date & time: 01/06/16  4098  Time seen 04:30 AM   History   Chief Complaint Chief Complaint  Patient presents with  . Rash    HPI Roy Savage is a 35 y.o. male.  HPI  patient states she started itching at 1 AM this morning. He states it's on his hands near [facility], his upper back and his right side. He reports he was seen 4 days ago for swelling of his lip and he was taken off his lisinopril. He states the only thing he had in common on these 2 ED visits was that he had eaten funyuns before hand. He denies anything else different or any new exposures. He denies swelling of his lips tonight, tongue swelling, difficulty swallowing or breathing.  PCP Dr Sherryll Burger in [place] and PCP [facility] Department   Past Medical History:  Diagnosis Date  . Diabetes mellitus without complication [facility])     Patient Active Problem List   Diagnosis Date Noted  . DKA (diabetic ketoacidoses) (HCC) 11/05/2013    No past surgical history on file.     Home Medications    Prior to Admission medications   Medication Sig Start Date End Date Taking? Authorizing Provider  insulin glargine (LANTUS) 100 UNIT/ML injection Inject 25 Units into the skin 2 (two) times daily.   Yes Historical Provider, MD  metFORMIN (GLUCOPHAGE) 1000 MG tablet Take 1,000 mg by mouth 2 (two) times daily with a meal.   Yes Historical Provider, MD  clindamycin (CLEOCIN) 150 MG capsule 2 po bid with food 07/31/15   Ivery Quale, PA-C  cyclobenzaprine (FLEXERIL) 10 MG tablet Take 1 tablet (10 mg total) by mouth 2 (two) times daily as needed for muscle spasms. 12/08/15   Elpidio Anis, PA-C  famotidine (PEPCID) 20 MG tablet Take 1 tablet (20 mg total) by mouth 2 (two) times daily. 01/06/16   Devoria Albe, MD  hydrOXYzine (VISTARIL) 25 MG capsule Take 1 capsule (25 mg total) by mouth every 6 (six) hours as needed for itching. 07/31/15   Ivery Quale, PA-C    insulin NPH-regular Human (NOVOLIN 70/30) (70-30) 100 UNIT/ML injection Inject 25 Units into the skin 2 (two) times daily with a meal. 11/07/13   Jerald Kief, MD  Insulin Syringe-Needle U-100 (INSULIN SYRINGE .5CC/31GX5/16") 31G X 5/16" 0.5 ML MISC 1 Device by Does not apply route 2 (two) times daily. 11/07/13   Jerald Kief, MD  Lancets (FREESTYLE) lancets Use as instructed 11/07/13   Jerald Kief, MD  naproxen (NAPROSYN) 500 MG tablet Take 1 tablet (500 mg total) by mouth 2 (two) times daily. 12/08/15   Elpidio Anis, PA-C  predniSONE (DELTASONE) 20 MG tablet Take 1 po BID x 3d then 1 po BID x 4d 01/06/16   Devoria Albe, MD  predniSONE (DELTASONE) 50 MG tablet Take 1 tablet (50 mg total) by mouth daily. 01/06/16   Devoria Albe, MD  predniSONE (DELTASONE) 50 MG tablet Take 1 tablet (50 mg total) by mouth daily. 01/06/16   Devoria Albe, MD    Family History Family History  Problem Relation Age of Onset  . Diabetes Other     Social History Social History  Substance Use Topics  . Smoking status: Never Smoker  . Smokeless tobacco: Never Used  . Alcohol use No     Allergies   Shellfish allergy   Review of Systems Review of Systems  All other systems  reviewed and are negative.    Physical Exam Updated Vital Signs BP 156/98 (BP Location: Left Arm)   Pulse (!) 55   Temp 97.9 F (36.6 C) (Oral)   Resp 18   Ht 5\' 11"  (1.803 m)   Wt 185 lb (83.9 kg)   SpO2 100%   BMI 25.80 kg/m   Vital signs normal bradycardia   Physical Exam  Constitutional: He is oriented to person, place, and time. He appears well-developed and well-nourished.  Non-toxic appearance. He does not appear ill. No distress.  HENT:  Head: Normocephalic and atraumatic.  Right Ear: External ear normal.  Left Ear: External ear normal.  Nose: Nose normal. No mucosal edema or rhinorrhea.  Mouth/Throat: Oropharynx is clear and moist and mucous membranes are normal. No dental abscesses or uvula swelling.  Eyes:  Conjunctivae and EOM are normal.  Neck: Normal range of motion and full passive range of motion without pain.  Cardiovascular: Normal rate.   Pulmonary/Chest: Effort normal. No respiratory distress. He has no rhonchi. He exhibits no crepitus.  Abdominal: Normal appearance.  Musculoskeletal: Normal range of motion. He exhibits no edema or tenderness.  Moves all extremities well.   Neurological: He is alert and oriented to person, place, and time. He has normal strength. No cranial nerve deficit.  Skin: Skin is warm, dry and intact. Rash noted. No erythema. No pallor.  Patient is noted to have small urticarial type lesions scattered on his left upper back with diffuse redness of the skin in a large area and on his right lateral rib cage area and on the dorsum of his hands near the webspace of the thumbs.  Psychiatric: He has a normal mood and affect. His speech is normal and behavior is normal. His mood appears not anxious.  Nursing note and vitals reviewed.    ED Treatments / Results  Labs (all labs ordered are listed, but only abnormal results are displayed) Labs Reviewed  CBG MONITORING, ED - Abnormal; Notable for the following:       Result Value   Glucose-Capillary 382 (*)    All other components within normal limits    hyperglycemia  EKG  EKG Interpretation None       Radiology No results found.  Procedures Procedures (including critical care time)  Medications Ordered in ED Medications  diphenhydrAMINE (BENADRYL) capsule 50 mg (50 mg Oral Given 01/06/16 0514)  predniSONE (DELTASONE) tablet 60 mg (60 mg Oral Given 01/06/16 0514)  famotidine (PEPCID) tablet 20 mg (20 mg Oral Given 01/06/16 0514)     Initial Impression / Assessment and Plan / ED Course  I have reviewed the triage vital signs and the nursing notes.  Pertinent labs & imaging results that were available during my care of the patient were reviewed by me and considered in my medical decision making (see  chart for details).  Clinical Course    Patient was given the choice of getting IM medications are oral, he elected to take oral. He was given Benadryl, prednisone, and Pepcid.  I noticed patient was diabetic, his CBG was checked. Patient states he had run out of his metformin for several days but he got it filled yesterday. He's only taken 1 dose. He also was seen at the health department yesterday and they increased his insulin from 20 units twice a day to 25 so he is oriented 1 dose of the increased insulin. I have a feeling patient does not follow his diabetic diet because he's been eating  funyuns. He was discharged home to take Benadryl and Pepcid. He was given a prescription for prednisone to only use if his rash is not improving. He also was given a reminder about diabetic diet and we discussed intenstly monitoring his blood sugar closely while he is on the prednisone. Patient was offered to get an IV and have IV fluids and some IV insulin however he states he feels like he can manage his diabetes at home.  Final Clinical Impressions(s) / ED Diagnoses   Final diagnoses:  Urticaria  Hyperglycemia    New Prescriptions New Prescriptions   FAMOTIDINE (PEPCID) 20 MG TABLET    Take 1 tablet (20 mg total) by mouth 2 (two) times daily.   PREDNISONE (DELTASONE) 20 MG TABLET    Take 1 po BID x 3d then 1 po BID x 4d    Plan discharge  Devoria AlbeIva Manon Banbury, MD, Concha PyoFACEP    Larrisa Cravey, MD 01/06/16 [phone]

## 2016-01-06 NOTE — ED Triage Notes (Signed)
Pt states he is itching all over his back and upper body and arms, states the only thing that is new is that he ate some funyun snacks tonight.

## 2016-01-06 NOTE — Discharge Instructions (Signed)
Avoid the funyuns for now. Take the pepcid twice a day with benadryl 50 mg every 6 hrs as needed for itching or rash. Stay cool, getting hot will make you itch more or get more rash.  Only take the prednisone if your rash isn't improving. You will need to monitor your blood sugars closely while on the prednisone. It will make your blood sugars get high. Look at the diabetic eating instructions.   Recheck if you get worse, such as have trouble breathing, swallowing.

## 2018-02-19 ENCOUNTER — Encounter (HOSPITAL_COMMUNITY): Payer: Self-pay | Admitting: Emergency Medicine

## 2018-02-19 ENCOUNTER — Emergency Department (HOSPITAL_COMMUNITY)
Admission: EM | Admit: 2018-02-19 | Discharge: 2018-02-19 | Disposition: A | Discharge status: Home / Self Care | Payer: Self-pay | Attending: Emergency Medicine | Admitting: Emergency Medicine

## 2018-02-19 ENCOUNTER — Other Ambulatory Visit: Payer: Self-pay

## 2018-02-19 DIAGNOSIS — Z794 Long term (current) use of insulin: Secondary | ICD-10-CM | POA: Insufficient documentation

## 2018-02-19 DIAGNOSIS — E119 Type 2 diabetes mellitus without complications: Secondary | ICD-10-CM | POA: Insufficient documentation

## 2018-02-19 DIAGNOSIS — H538 Other visual disturbances: Secondary | ICD-10-CM | POA: Insufficient documentation

## 2018-02-19 MED ORDER — TETRACAINE HCL 0.5 % OP SOLN
2.0000 [drp] | Freq: Once | OPHTHALMIC | Status: AC
  Administered 2018-02-19: 2 [drp] via OPHTHALMIC
  Filled 2018-02-19: qty 4

## 2018-02-19 NOTE — ED Triage Notes (Signed)
Patient reports seeing "little dots" and having some blurred vision since Monday. Reports his vision has changed "a lot" since Monday.

## 2018-02-19 NOTE — Discharge Instructions (Addendum)
Call Dr Laruth Bouchard office tomorrow morning. Tell them you were seen in the emergency room and the ER physician wanted you to follow-up today to be seen for possible retinal detachment.

## 2018-02-19 NOTE — ED Notes (Signed)
R eye 20/15 L eye 20/70

## 2018-03-01 NOTE — ED Provider Notes (Signed)
[facility] EMERGENCY DEPARTMENT Provider Note   CSN: [id number] Arrival date & time: 02/19/18  1810     History   Chief Complaint Chief Complaint  Patient presents with  . Blurred Vision    HPI Roy Savage is a 38 y.o. male.  HPI  38yM with intermittent visual loss in L eye. First noticed when playing basketball on Monday. Denies getting hit or other trauma. Vision has "come and gone" since then. Vision will return to baseline. No obvious precipitant for worsening it. Triage note alludes to seeing spots. Pt denies this but has a hard time describes exactly. Asked about flashing, spots, lines, wavy, field cut, etc. Says vision just gets blurred. No pain. No diplopia. Doesn't wear corrective lenses.   Past Medical History:  Diagnosis Date  . Diabetes mellitus without complication [facility])     Patient Active Problem List   Diagnosis Date Noted  . DKA (diabetic ketoacidoses) (HCC) 11/05/2013    History reviewed. No pertinent surgical history.      Home Medications    Prior to Admission medications   Medication Sig Start Date End Date Taking? Authorizing Provider  glipiZIDE (GLUCOTROL) 10 MG tablet Take 10 mg by mouth 2 (two) times daily. 01/06/18  Yes [provider]  insulin glargine (LANTUS) 100 UNIT/ML injection Inject 25 Units into the skin 2 (two) times daily.   Yes [provider]    Family History Family History  Problem Relation Age of Onset  . Diabetes Other     Social History Social History   Tobacco Use  . Smoking status: Never Smoker  . Smokeless tobacco: Never Used  Substance Use Topics  . Alcohol use: No  . Drug use: No     Allergies   Shellfish allergy   Review of Systems Review of Systems  All systems reviewed and negative, other than as noted in HPI.  Physical Exam Updated Vital Signs BP (!) 144/92 (BP Location: Right Arm)   Pulse 82   Temp 98 F (36.7 C) (Oral)   Resp 16   Ht 5\' 10"  (1.778 m)   Wt 93 kg    SpO2 98%   BMI 29.41 kg/m   Physical Exam Vitals signs and nursing note reviewed.  Constitutional:      General: He is not in acute distress.    Appearance: He is well-developed.  HENT:     Head: Normocephalic and atraumatic.  Eyes:     General:        Right eye: No discharge.        Left eye: No discharge.     Extraocular Movements: Extraocular movements intact.     Conjunctiva/sclera: Conjunctivae normal.     Pupils: Pupils are equal, round, and reactive to light.     Comments: Lids, lashes and periorbital tissues normal in appearance.  Conjunctive are normal.  Cornea is clear.  No focal uptake of floor seen.  Intraocular pressures are 16 OS and 18 OD.  Do not see any obvious concerning pathology on bedside ultrasound.  No obvious vitreous hemorrhage, lens dislocation or large retinal detachment.  Neck:     Musculoskeletal: Neck supple.  Cardiovascular:     Rate and Rhythm: Normal rate and regular rhythm.     Heart sounds: Normal heart sounds. No murmur. No friction rub. No gallop.   Pulmonary:     Effort: Pulmonary effort is normal. No respiratory distress.     Breath sounds: Normal breath sounds.  Abdominal:  General: There is no distension.     Palpations: Abdomen is soft.     Tenderness: There is no abdominal tenderness.  Musculoskeletal:        General: No tenderness.  Skin:    General: Skin is warm and dry.  Neurological:     Mental Status: He is alert.  Psychiatric:        Behavior: Behavior normal.        Thought Content: Thought content normal.      ED Treatments / Results  Labs (all labs ordered are listed, but only abnormal results are displayed) Labs Reviewed - No data to display  EKG None  Radiology No results found.  Procedures Procedures (including critical care time)  Medications Ordered in ED Medications  tetracaine (PONTOCAINE) 0.5 % ophthalmic solution 2 drop (2 drops Both Eyes Given 02/19/18 2100)     Initial Impression /  Assessment and Plan / ED Course  I have reviewed the triage vital signs and the nursing notes.  Pertinent labs & imaging results that were available during my care of the patient were reviewed by me and considered in my medical decision making (see chart for details).     39 year old male with intermittent blurred vision in his left eye.  Unclear etiology.  He has a hard time describing his symptoms.  Reports complete resolution at times.  His exam here is pretty unremarkable despite him currently being symptomatic.  Advise close to mildly follow-up within the next 48 hours.  Final Clinical Impressions(s) / ED Diagnoses   Final diagnoses:  Blurred vision, left eye    ED Discharge Orders    None       Raeford Razor, MD 03/02/18 1528

## 2019-06-15 ENCOUNTER — Other Ambulatory Visit: Payer: Self-pay

## 2019-06-15 ENCOUNTER — Emergency Department (HOSPITAL_COMMUNITY)
Admission: EM | Admit: 2019-06-15 | Discharge: 2019-06-16 | Disposition: A | Discharge status: Home / Self Care | Payer: Self-pay | Attending: Emergency Medicine | Admitting: Emergency Medicine

## 2019-06-15 DIAGNOSIS — L089 Local infection of the skin and subcutaneous tissue, unspecified: Secondary | ICD-10-CM | POA: Insufficient documentation

## 2019-06-15 DIAGNOSIS — Y999 Unspecified external cause status: Secondary | ICD-10-CM | POA: Insufficient documentation

## 2019-06-15 DIAGNOSIS — E119 Type 2 diabetes mellitus without complications: Secondary | ICD-10-CM | POA: Insufficient documentation

## 2019-06-15 DIAGNOSIS — Y939 Activity, unspecified: Secondary | ICD-10-CM | POA: Insufficient documentation

## 2019-06-15 DIAGNOSIS — Z794 Long term (current) use of insulin: Secondary | ICD-10-CM | POA: Insufficient documentation

## 2019-06-15 DIAGNOSIS — W57XXXA Bitten or stung by nonvenomous insect and other nonvenomous arthropods, initial encounter: Secondary | ICD-10-CM | POA: Insufficient documentation

## 2019-06-15 DIAGNOSIS — S30861A Insect bite (nonvenomous) of abdominal wall, initial encounter: Secondary | ICD-10-CM | POA: Insufficient documentation

## 2019-06-15 DIAGNOSIS — Y929 Unspecified place or not applicable: Secondary | ICD-10-CM | POA: Insufficient documentation

## 2019-06-15 NOTE — ED Triage Notes (Signed)
Patient states that he was bitten from an insect two weeks ago. Patient has what appears to be an abscess or bite in the center of his chest. Patient states that the site has not been healing. Patient states that he has been using alcohol and neosporin.

## 2019-06-16 ENCOUNTER — Other Ambulatory Visit: Payer: Self-pay

## 2019-06-16 ENCOUNTER — Encounter (HOSPITAL_COMMUNITY): Payer: Self-pay | Admitting: Emergency Medicine

## 2019-06-16 MED ORDER — DOXYCYCLINE HYCLATE 100 MG PO CAPS: 100.0000 mg | ORAL_CAPSULE | Freq: Two times a day (BID) | ORAL | 0 refills | Status: DC

## 2019-06-16 NOTE — Discharge Instructions (Addendum)
You were seen today for possible bug bite.  Whether this was a bug bite or a bump, and now appears infected.  Apply warm compresses and take antibiotics as prescribed.

## 2019-06-16 NOTE — ED Provider Notes (Signed)
[facility] EMERGENCY DEPARTMENT Provider Note   CSN: [id number] Arrival date & time: 06/15/19  2346     History Chief Complaint  Patient presents with  . Insect Bite    Roy Savage is a 39 y.o. male.  HPI     This a 39 year old male with a history of diabetes who presents with possible insect bite.  Patient reports that he noted a bump on his anterior abdomen approximately 2 weeks ago.  It became itchy and he scratched it.  Since that time he states that it has become larger.  When he gets in the shower he squeezes it and "maybe something came out."  He has not noted any fevers.  He states that it is not getting any better.  He is washing it with alcohol swabs.  Denies significant pain but states it is tender to touch when he squeezes it.  Past Medical History:  Diagnosis Date  . Diabetes mellitus without complication [facility])     Patient Active Problem List   Diagnosis Date Noted  . DKA (diabetic ketoacidoses) ([place]) 11/05/2013    History reviewed. No pertinent surgical history.     Family History  Problem Relation Age of Onset  . Diabetes Other     Social History   Tobacco Use  . Smoking status: Never Smoker  . Smokeless tobacco: Never Used  Substance Use Topics  . Alcohol use: No  . Drug use: No    Home Medications Prior to Admission medications   Medication Sig Start Date End Date Taking? Authorizing Provider  doxycycline (VIBRAMYCIN) 100 MG capsule Take 1 capsule (100 mg total) by mouth 2 (two) times daily. 06/16/19   Axie Hayne, Barbette Hair, MD  glipiZIDE (GLUCOTROL) 10 MG tablet Take 10 mg by mouth 2 (two) times daily. 01/06/18   [provider]  insulin glargine (LANTUS) 100 UNIT/ML injection Inject 25 Units into the skin 2 (two) times daily.    [provider]    Allergies    Shellfish allergy  Review of Systems   Review of Systems  Constitutional: Negative for fever.  Skin: Positive for color change and wound.  All other systems  reviewed and are negative.   Physical Exam Updated Vital Signs BP (!) 146/96 (BP Location: Right Arm)   Pulse 92   Temp 97.8 F (36.6 C) (Oral)   Resp 18   Ht 1.803 m (5\' 11" )   Wt 90.7 kg   SpO2 100%   BMI 27.89 kg/m   Physical Exam Vitals and nursing note reviewed.  Constitutional:      Appearance: He is well-developed. He is not ill-appearing.  HENT:     Head: Normocephalic and atraumatic.     Nose: Nose normal.  Eyes:     Pupils: Pupils are equal, round, and reactive to light.  Cardiovascular:     Rate and Rhythm: Normal rate and regular rhythm.  Pulmonary:     Effort: Pulmonary effort is normal. No respiratory distress.  Abdominal:     Palpations: Abdomen is soft.     Tenderness: There is no abdominal tenderness.     Comments: 1 x 2 cm raised area just over the left upper abdomen with overlying scabbing, no fluctuance but some mild adjacent erythema, no spontaneous drainage noted  Musculoskeletal:     Cervical back: Neck supple.     Right lower leg: No edema.     Left lower leg: No edema.  Skin:    General: Skin is  warm and dry.  Neurological:     Mental Status: He is alert and oriented to person, place, and time.  Psychiatric:        Mood and Affect: Mood normal.     ED Results / Procedures / Treatments   Labs (all labs ordered are listed, but only abnormal results are displayed) Labs Reviewed - No data to display  EKG None  Radiology No results found.  Procedures Procedures (including critical care time)  Medications Ordered in ED Medications - No data to display  ED Course  I have reviewed the triage vital signs and the nursing notes.  Pertinent labs & imaging results that were available during my care of the patient were reviewed by me and considered in my medical decision making (see chart for details).    MDM Rules/Calculators/A&P                       Patient presents with what he thought was an insect bite.  Whether this was an  insect bite or a pimple that became infected, it does not appear infected.  There is no drainable abscess but likely was at some point.  He has some adjacent erythema.  Recommend warm compresses and will start on doxycycline for mild cellulitis.  Recommend recheck in 2 to 3 days if not improving.  After history, exam, and medical workup I feel the patient has been appropriately medically screened and is safe for discharge home. Pertinent diagnoses were discussed with the patient. Patient was given return precautions.   Final Clinical Impression(s) / ED Diagnoses Final diagnoses:  Bug bite with infection, initial encounter    Rx / DC Orders ED Discharge Orders         Ordered    doxycycline (VIBRAMYCIN) 100 MG capsule  2 times daily     06/16/19 0016           Barnaby Rippeon, Mayer Masker, MD 06/16/19 0020

## 2021-11-01 ENCOUNTER — Encounter: Payer: Self-pay | Admitting: Podiatry

## 2021-11-01 ENCOUNTER — Ambulatory Visit: Payer: Medicaid Other | Admitting: Podiatry

## 2021-11-01 DIAGNOSIS — L600 Ingrowing nail: Secondary | ICD-10-CM | POA: Diagnosis not present

## 2021-11-02 NOTE — Progress Notes (Signed)
Subjective:   Patient ID: Roy Savage, male   DOB: 41 y.o.   MRN: [id number]   HPI Patient presents stating he has had a painful ingrown toenail of his right big toe medial border and states that he has tried to soak it and he currently has a A1c of 10 and he is supposed to be getting the pump in the near future.  Patient does not smoke likes to be active   Review of Systems  All other systems reviewed and are negative.       Objective:  Physical Exam Vitals and nursing note reviewed.  Constitutional:      Appearance: He is well-developed.  Pulmonary:     Effort: Pulmonary effort is normal.  Musculoskeletal:        General: Normal range of motion.  Skin:    General: Skin is warm.  Neurological:     Mental Status: He is alert.     Neurovascular status was found to be intact muscle strength is found to be adequate range of motion adequate.  Patient does have incurvation of the medial border of the right hallux localized no erythema edema or drainage is associated with the chest pain and patient does have good digital perfusion well-oriented x3     Assessment:  Ingrown toenail deformity right hallux with patient who is not a well-controlled diabetic currently     Plan:  H&P condition reviewed.  I do not recommend a permanent correction at this time due to his history of poor control of his diabetes and I encouraged better control and hopefully the pump will allow that.  I went ahead today infiltrated the right hallux 60 mg Xylocaine Marcaine mixture sterile prep done and I just remove the medial border in a careful fashion and cleaned tissue up flushed and applied sterile dressing.  I instructed him on soaks and I did explain that I am not doing anything at this time invasive as I want to make sure that heals well and I gave him encouragement to call if any issues were to occur and that this will probably need to be repeated every 6 months to a year until someday hopefully we can  do a permanent procedure

## 2021-12-16 ENCOUNTER — Other Ambulatory Visit: Payer: Self-pay

## 2021-12-16 ENCOUNTER — Emergency Department: Payer: Medicaid Other

## 2021-12-16 DIAGNOSIS — E109 Type 1 diabetes mellitus without complications: Secondary | ICD-10-CM | POA: Insufficient documentation

## 2021-12-16 DIAGNOSIS — Y9367 Activity, basketball: Secondary | ICD-10-CM | POA: Diagnosis not present

## 2021-12-16 DIAGNOSIS — S2232XA Fracture of one rib, left side, initial encounter for closed fracture: Secondary | ICD-10-CM | POA: Insufficient documentation

## 2021-12-16 DIAGNOSIS — W19XXXA Unspecified fall, initial encounter: Secondary | ICD-10-CM | POA: Insufficient documentation

## 2021-12-16 DIAGNOSIS — S299XXA Unspecified injury of thorax, initial encounter: Secondary | ICD-10-CM | POA: Diagnosis present
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
## 2021-12-17 ENCOUNTER — Emergency Department
Admission: EM | Admit: 2021-12-17 | Discharge: 2021-12-17 | Disposition: A | Discharge status: Home / Self Care | Payer: Medicaid Other | Attending: Emergency Medicine | Admitting: Emergency Medicine

## 2021-12-17 DIAGNOSIS — S2232XA Fracture of one rib, left side, initial encounter for closed fracture: Secondary | ICD-10-CM

## 2021-12-17 MED ORDER — ACETAMINOPHEN 500 MG PO TABS
1000.0000 mg | ORAL_TABLET | Freq: Once | ORAL | Status: AC
  Administered 2021-12-17: 1000 mg via ORAL
  Filled 2021-12-17: qty 2

## 2021-12-17 MED ORDER — OXYCODONE HCL 5 MG PO TABS: 5.0000 mg | ORAL_TABLET | Freq: Three times a day (TID) | ORAL | 0 refills | Status: DC | PRN

## 2021-12-17 MED ORDER — KETOROLAC TROMETHAMINE 30 MG/ML IJ SOLN
30.0000 mg | Freq: Once | INTRAMUSCULAR | Status: AC
  Administered 2021-12-17: 30 mg via INTRAMUSCULAR
  Filled 2021-12-17: qty 1
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
## 2021-12-17 NOTE — Discharge Instructions (Signed)
Take acetaminophen 650 mg and ibuprofen 400 mg every 6 hours for pain.  Take with food. Take oxycodone for breakthrough pain as prescribed. Thank you for choosing [place] for your health care today!  Please see your primary doctor this week for a follow up appointment.   If you do not have a primary doctor call the following clinics to establish care:  If you have insurance:  [facility] [phone] [address] [place] [place] [address]   [facility]  [phone] [address] [place] [place] [address]   If you do not have insurance:  [facility]  [phone] [address] [place] [address]  Sometimes, in the early stages of certain disease courses it is difficult to detect in the emergency department evaluation -- so, it is important that you continue to monitor your symptoms and call your doctor right away or return to the emergency department if you develop any new or worsening symptoms.  It was my pleasure to care for you today.   Daneil Dan Modesto Charon, MD

## 2022-05-03 LAB — LIPID PANEL
LDL Cholesterol: 132
Triglycerides: 128 (ref 40–160)

## 2022-08-02 LAB — MICROALBUMIN / CREATININE URINE RATIO: Microalb Creat Ratio: 123

## 2022-08-02 LAB — COMPREHENSIVE METABOLIC PANEL: eGFR: 55

## 2022-08-02 LAB — HEMOGLOBIN A1C: Hemoglobin A1C: 9.8

## 2022-08-02 LAB — BASIC METABOLIC PANEL
BUN: 29 — AB (ref 4–21)
Creatinine: 1.6 — AB (ref 0.6–1.3)
Glucose: 131

## 2022-10-30 ENCOUNTER — Encounter: Payer: Medicaid Other | Attending: Nurse Practitioner | Admitting: Nutrition

## 2022-10-30 DIAGNOSIS — E139 Other specified diabetes mellitus without complications: Secondary | ICD-10-CM | POA: Insufficient documentation

## 2022-10-30 DIAGNOSIS — Z713 Dietary counseling and surveillance: Secondary | ICD-10-CM | POA: Insufficient documentation

## 2022-11-05 NOTE — Progress Notes (Unsigned)
Patietn is here with his wife.  Says wants to take better care of himself and needs help with his diet.    Will be starting on OmniPod in a few days.

## 2022-11-26 LAB — HEMOGLOBIN A1C: Hemoglobin A1C: 10.3

## 2022-12-10 ENCOUNTER — Ambulatory Visit: Payer: Medicaid Other | Admitting: Nutrition

## 2022-12-20 LAB — VITAMIN D 25 HYDROXY (VIT D DEFICIENCY, FRACTURES): Vit D, 25-Hydroxy: 15

## 2022-12-20 LAB — LIPID PANEL
LDL Cholesterol: 181
Triglycerides: 101 (ref 40–160)

## 2022-12-20 LAB — TSH: TSH: 1.5 (ref 0.41–5.90)

## 2023-01-07 NOTE — Patient Instructions (Incomplete)

## 2023-01-09 ENCOUNTER — Ambulatory Visit: Payer: Medicaid Other | Admitting: Nurse Practitioner

## 2023-01-09 DIAGNOSIS — Z794 Long term (current) use of insulin: Secondary | ICD-10-CM

## 2023-01-09 DIAGNOSIS — E139 Other specified diabetes mellitus without complications: Secondary | ICD-10-CM

## 2023-02-12 ENCOUNTER — Encounter (INDEPENDENT_AMBULATORY_CARE_PROVIDER_SITE_OTHER): Payer: Self-pay | Admitting: *Deleted

## 2023-02-20 ENCOUNTER — Ambulatory Visit: Payer: Medicaid Other | Admitting: Podiatry

## 2023-02-20 ENCOUNTER — Encounter: Payer: Self-pay | Admitting: Podiatry

## 2023-02-20 DIAGNOSIS — L600 Ingrowing nail: Secondary | ICD-10-CM

## 2023-02-20 NOTE — Progress Notes (Signed)
 Subjective:   Patient ID: Roy Savage, male   DOB: 43 y.o.   MRN: [id number]   HPI The patient presents stating that he cannot take care of nailbeds has had ingrown's he is concerned about further ingrown's he is concerned about elongation of the nailbeds and his health history   ROS      Objective:  Physical Exam  Neurovascular status is not changed from previous visit digital perfusion noted to be normal patient is found to have nail disease 1-5 both feet that are elongated and there is an ingrown component with history of ingrown correction eft patient with health issues along with diabetes with lesion that are under control nail disease with incurvation of border     Assessment:  This is listed above     Plan:  H&P reviewed and discussed different options available to him.  Today debridement accomplished this can be done we did discuss corruption of ingrown toenails which may be need to be done in the future but we will get a try to hold off and just utilize these techniques which was done courtesy and he will be seen back as needed and can get pedicures

## 2023-03-04 LAB — COMPREHENSIVE METABOLIC PANEL: eGFR: 57

## 2023-03-04 LAB — BASIC METABOLIC PANEL
BUN: 28 — AB (ref 4–21)
Creatinine: 1.5 — AB (ref 0.6–1.3)
Glucose: 178

## 2023-03-04 LAB — MICROALBUMIN / CREATININE URINE RATIO: Microalb Creat Ratio: 682

## 2023-03-25 ENCOUNTER — Encounter: Payer: Self-pay | Admitting: Urology

## 2023-03-25 ENCOUNTER — Ambulatory Visit: Payer: Medicaid Other | Admitting: Urology

## 2023-03-25 VITALS — BP 145/91 | HR 79

## 2023-03-25 DIAGNOSIS — Z125 Encounter for screening for malignant neoplasm of prostate: Secondary | ICD-10-CM | POA: Diagnosis not present

## 2023-03-25 NOTE — Progress Notes (Signed)
 03/25/2023 11:04 AM   Janey Greaser 08-19-1980 [id number]  Referring provider: Aurelio Brash, FNP [address],  [place] [address]  No chief complaint on file.   HPI: New pt -   1) PCa screening - Nov 2024 PSA 1.1. Denies any FH of PCa. No weak st, frequency, or urgency.   He does Aeronautical engineer.    PMH: Past Medical History:  Diagnosis Date   Diabetes mellitus without complication (HCC)     Surgical History: No past surgical history on file.  Home Medications:  Allergies as of 03/25/2023       Reactions   Shellfish Allergy Hives, Rash        Medication List        Accurate as of March 25, 2023 11:04 AM. If you have any questions, ask your nurse or doctor.          glipiZIDE 10 MG tablet Commonly known as: GLUCOTROL Take 10 mg by mouth 2 (two) times daily.   insulin glargine 100 UNIT/ML injection Commonly known as: LANTUS Inject 25 Units into the skin 2 (two) times daily.   oxyCODONE 5 MG immediate release tablet Commonly known as: Roxicodone Take 1 tablet (5 mg total) by mouth every 8 (eight) hours as needed for up to 8 doses.        Allergies:  Allergies  Allergen Reactions   Shellfish Allergy Hives and Rash    Family History: Family History  Problem Relation Age of Onset   Diabetes Other     Social History:  reports that he has never smoked. He has never used smokeless tobacco. He reports that he does not drink alcohol and does not use drugs.   Physical Exam: There were no vitals taken for this visit.  Constitutional:  Alert and oriented, No acute distress. HEENT: [place] AT, moist mucus membranes.  Trachea midline, no masses. Cardiovascular: No clubbing, cyanosis, or edema. Respiratory: Normal respiratory effort, no increased work of breathing. GI: Abdomen is soft, nontender, nondistended, no abdominal masses GU: No CVA tenderness Skin: No rashes, bruises or suspicious lesions. Neurologic: Grossly intact, no  focal deficits, moving all 4 extremities. Psychiatric: Normal mood and affect.  Laboratory Data: Lab Results  Component Value Date   WBC 14.1 (H) 11/06/2013   HGB 16.7 11/06/2013   HCT 47.0 11/06/2013   MCV 82.3 11/06/2013   PLT 187 11/06/2013    Lab Results  Component Value Date   CREATININE 1.03 11/06/2013    No results found for: "PSA"  No results found for: "TESTOSTERONE"  Lab Results  Component Value Date   HGBA1C 12.7 (H) 11/05/2013    Urinalysis    Component Value Date/Time   COLORURINE YELLOW 11/05/2013 1518   APPEARANCEUR CLEAR 11/05/2013 1518   LABSPEC 1.025 11/05/2013 1518   PHURINE 5.5 11/05/2013 1518   GLUCOSEU >1000 (A) 11/05/2013 1518   HGBUR SMALL (A) 11/05/2013 1518   BILIRUBINUR NEGATIVE 11/05/2013 1518   KETONESUR 40 (A) 11/05/2013 1518   PROTEINUR NEGATIVE 11/05/2013 1518   UROBILINOGEN 0.2 11/05/2013 1518   NITRITE NEGATIVE 11/05/2013 1518   LEUKOCYTESUR NEGATIVE 11/05/2013 1518    Lab Results  Component Value Date   BACTERIA RARE 11/05/2013    Pertinent Imaging: N/a    Assessment & Plan:    Prostate cancer screening - his PSA is normal. We discussed DRE with PSA is a better screening option but he declined DRE. I drew him a picture of the anatomy and rationale for  DRE and again he declined. Per guidelines, he can repeat PSA in 1-2 yr intervals.   No follow-ups on file.  Jerilee Field, MD  [facility]  [address] [place], [place] [address] [phone]

## 2023-04-10 NOTE — Patient Instructions (Signed)

## 2023-04-11 ENCOUNTER — Encounter: Payer: Medicaid Other | Admitting: Nurse Practitioner

## 2023-04-11 DIAGNOSIS — E139 Other specified diabetes mellitus without complications: Secondary | ICD-10-CM

## 2023-04-11 DIAGNOSIS — I10 Essential (primary) hypertension: Secondary | ICD-10-CM

## 2023-04-11 DIAGNOSIS — E782 Mixed hyperlipidemia: Secondary | ICD-10-CM

## 2023-04-11 DIAGNOSIS — E559 Vitamin D deficiency, unspecified: Secondary | ICD-10-CM

## 2023-04-11 DIAGNOSIS — Z794 Long term (current) use of insulin: Secondary | ICD-10-CM

## 2023-04-11 NOTE — Progress Notes (Signed)
 Erroneous encounter

## 2023-05-31 NOTE — Patient Instructions (Signed)

## 2023-06-03 ENCOUNTER — Encounter: Payer: Self-pay | Admitting: Nurse Practitioner

## 2023-06-03 ENCOUNTER — Ambulatory Visit (INDEPENDENT_AMBULATORY_CARE_PROVIDER_SITE_OTHER): Admitting: Nurse Practitioner

## 2023-06-03 VITALS — BP 136/86 | HR 93 | Ht 70.0 in | Wt 225.2 lb

## 2023-06-03 DIAGNOSIS — Z794 Long term (current) use of insulin: Secondary | ICD-10-CM | POA: Diagnosis not present

## 2023-06-03 DIAGNOSIS — E139 Other specified diabetes mellitus without complications: Secondary | ICD-10-CM

## 2023-06-03 DIAGNOSIS — Z7984 Long term (current) use of oral hypoglycemic drugs: Secondary | ICD-10-CM

## 2023-06-03 DIAGNOSIS — E782 Mixed hyperlipidemia: Secondary | ICD-10-CM

## 2023-06-03 LAB — POCT GLYCOSYLATED HEMOGLOBIN (HGB A1C): Hemoglobin A1C: 7.9 % — AB (ref 4.0–5.6)

## 2023-06-03 MED ORDER — INSULIN LISPRO 100 UNIT/ML IJ SOLN: INTRAMUSCULAR | 3 refills | Status: DC

## 2023-06-03 NOTE — Progress Notes (Signed)
 Endocrinology Consult Note       06/03/2023, 12:16 PM   Subjective:    Patient ID: Roy Savage, male    DOB: 15-Apr-1980.  Roy Savage is being seen in consultation for management of currently uncontrolled symptomatic diabetes requested by  McCaskill-Gainey, Tenesa, FNP.   Past Medical History:  Diagnosis Date   Diabetes mellitus without complication (HCC)     History reviewed. No pertinent surgical history.  Social History   Socioeconomic History   Marital status: Single    Spouse name: Not on file   Number of children: Not on file   Years of education: Not on file   Highest education level: Not on file  Occupational History   Not on file  Tobacco Use   Smoking status: Never   Smokeless tobacco: Never  Vaping Use   Vaping status: Never Used  Substance and Sexual Activity   Alcohol use: No   Drug use: No   Sexual activity: Yes    Birth control/protection: None  Other Topics Concern   Not on file  Social History Narrative   Not on file   Social Drivers of Health   Financial Resource Strain: Not on file  Food Insecurity: Not on file  Transportation Needs: Not on file  Physical Activity: Not on file  Stress: Not on file  Social Connections: Not on file    Family History  Problem Relation Age of Onset   Diabetes Other     Outpatient Encounter Medications as of 06/03/2023  Medication Sig   Continuous Glucose Sensor (DEXCOM G7 SENSOR) MISC    Insulin  Disposable Pump (OMNIPOD 5 DEXG7G6 PODS GEN 5) MISC Inject into the skin.   insulin  lispro (HUMALOG) 100 UNIT/ML injection Use with Omnipod for TDD around 80 units   losartan (COZAAR) 25 MG tablet Take 25 mg by mouth daily.   rosuvastatin (CRESTOR) 40 MG tablet Take 40 mg by mouth daily.   [DISCONTINUED] glipiZIDE (GLUCOTROL) 10 MG tablet Take 10 mg by mouth 2 (two) times daily.   [DISCONTINUED] insulin  glargine (LANTUS ) 100 UNIT/ML  injection Inject 25 Units into the skin 2 (two) times daily.   [DISCONTINUED] oxyCODONE  (ROXICODONE ) 5 MG immediate release tablet Take 1 tablet (5 mg total) by mouth every 8 (eight) hours as needed for up to 8 doses.   No facility-administered encounter medications on file as of 06/03/2023.    ALLERGIES: Allergies  Allergen Reactions   Shellfish Allergy Hives and Rash    VACCINATION STATUS: Immunization History  Administered Date(s) Administered   Influenza,inj,Quad PF,6+ Mos 11/06/2013   Pneumococcal Polysaccharide-23 11/06/2013    Diabetes He presents for his initial diabetic visit. 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 Onset time: diagnosed at approx age of 34. His disease course has been stable. Hypoglycemia symptoms include nervousness/anxiousness, sweats and tremors. There are no diabetic associated symptoms. Pertinent negatives for diabetes include no polyuria. There are no hypoglycemic complications. Diabetic complications include nephropathy and retinopathy. (Reports DKA 2-3 years ago) Risk factors for coronary artery disease include diabetes mellitus, dyslipidemia, male sex and hypertension. Current diabetic treatment includes insulin  pump (Omnipod 5). His weight is fluctuating minimally. He is following a generally  unhealthy diet. When asked about meal planning, he reported none. He has not had a previous visit with a dietitian. He participates in exercise intermittently. His home blood glucose trend is fluctuating minimally. His overall blood glucose range is 140-180 mg/dl. (He presents today for his consultation with his CGM showing mostly at goal glycemic profile.  His POCT A1c today is 7.9%, improving from previous A1c of 10.3%.  He is somewhat of a poor historian.  He notes he was recently taken off Ozempic, has been on Glipizide and Farxiga in the past as well.  He was diagnosed with LADA, unsure of how this was confirmed as there were no labs sent with the referral to confirm  this.  Analysis of his CGM shows TIR 64%, TAR 36%, TBR <1% with a GMI of 7.5%.  He drinks mostly water, some gatorade zero sugar, some juice if he is hypoglycemia.  He eats on average 1-2 meals per day.  He just recently started exercising more.  He is UTD on eye exam (gets shots in his eyes), and sees podiatry.  ) An ACE inhibitor/angiotensin II receptor blocker is not being taken. He sees a podiatrist.Eye exam is current.     Review of systems  Constitutional: + Minimally fluctuating body weight, current Body mass index is 32.31 kg/m., no fatigue, no subjective hyperthermia, no subjective hypothermia Eyes: no blurry vision, no xerophthalmia ENT: no sore throat, no nodules palpated in throat, no dysphagia/odynophagia, no hoarseness Cardiovascular: no chest pain, no shortness of breath, no palpitations, no leg swelling Respiratory: no cough, no shortness of breath Gastrointestinal: no nausea/vomiting/diarrhea Musculoskeletal: + intermittent diffuse muscle/joint aches Skin: no rashes, no hyperemia Neurological: no tremors, no numbness, no tingling, no dizziness Psychiatric: no depression, no anxiety  Objective:     BP 136/86 (BP Location: Right Arm, Patient Position: Sitting, Cuff Size: Large)   Pulse 93   Ht 5\' 10"  (1.778 m)   Wt 225 lb 3.2 oz (102.2 kg)   BMI 32.31 kg/m   Wt Readings from Last 3 Encounters:  06/03/23 225 lb 3.2 oz (102.2 kg)  12/16/21 210 lb (95.3 kg)  06/16/19 200 lb (90.7 kg)     BP Readings from Last 3 Encounters:  06/03/23 136/86  03/25/23 (!) 145/91  12/17/21 137/85     Physical Exam- Limited  Constitutional:  Body mass index is 32.31 kg/m. , not in acute distress, somewhat distracted state of mind (answered phone call and texts during visit) Eyes:  EOMI, no exophthalmos Neck: Supple Cardiovascular: RRR, no murmurs, rubs, or gallops, no edema Respiratory: Adequate breathing efforts, no crackles, rales, rhonchi, or wheezing Musculoskeletal: no  gross deformities, strength intact in all four extremities, no gross restriction of joint movements Skin:  no rashes, no hyperemia Neurological: no tremor with outstretched hands   Diabetic Foot Exam - Simple   No data filed      CMP ( most recent) CMP     Component Value Date/Time   NA 139 11/06/2013 0931   K 4.5 11/06/2013 0931   CL 103 11/06/2013 0931   CO2 21 11/06/2013 0931   GLUCOSE 187 (H) 11/06/2013 0931   BUN 28 (A) 03/04/2023 0000   CREATININE 1.5 (A) 03/04/2023 0000   CREATININE 1.03 11/06/2013 0931   CALCIUM  9.5 11/06/2013 0931   PROT 8.1 11/06/2013 0409   ALBUMIN 4.2 11/06/2013 0409   AST 18 11/06/2013 0409   ALT 20 11/06/2013 0409   ALKPHOS 56 11/06/2013 0409   BILITOT 1.0 11/06/2013  0409   EGFR 57 03/04/2023 0000   GFRNONAA >90 11/06/2013 0931     Diabetic Labs (most recent): Lab Results  Component Value Date   HGBA1C 7.9 (A) 06/03/2023   HGBA1C 10.3 11/26/2022   HGBA1C 9.8 08/02/2022     Lipid Panel ( most recent) Lipid Panel     Component Value Date/Time   TRIG 101 12/20/2022 0000   LDLCALC 181 12/20/2022 0000      Lab Results  Component Value Date   TSH 1.50 12/20/2022           Assessment & Plan:   1) Latent autoimmune diabetes in adults (LADA), managed as type 1 (HCC) (Primary)  He presents today for his consultation with his CGM showing mostly at goal glycemic profile.  His POCT A1c today is 7.9%, improving from previous A1c of 10.3%.  He is somewhat of a poor historian.  He notes he was recently taken off Ozempic, has been on Glipizide and Farxiga in the past as well.  He was diagnosed with LADA, unsure of how this was confirmed as there were no labs sent with the referral to confirm this.  Analysis of his CGM shows TIR 64%, TAR 36%, TBR <1% with a GMI of 7.5%.  He drinks mostly water, some gatorade zero sugar, some juice if he is hypoglycemia.  He eats on average 1-2 meals per day.  He just recently started exercising more.  He  is UTD on eye exam (gets shots in his eyes), and sees podiatry.    - Roy Savage has currently uncontrolled symptomatic type LADA since 43 years of age, with most recent A1c of 7.9 %.   -Recent labs reviewed.  - I had a long discussion with him about the progressive nature of diabetes and the pathology behind its complications. -his diabetes is currently complicated by retinopathy and moderate CKD but he remains at a high risk for more acute and chronic complications which include CAD, CVA, CKD, retinopathy, and neuropathy. These are all discussed in detail with him.  The following Lifestyle Medicine recommendations according to American College of Lifestyle Medicine [facility]) were discussed and offered to patient and he agrees to start the journey:  A. Whole Foods, Plant-based plate comprising of fruits and vegetables, plant-based proteins, whole-grain carbohydrates was discussed in detail with the patient.   A list for source of those nutrients were also provided to the patient.  Patient will use only water or unsweetened tea for hydration. B.  The need to stay away from risky substances including alcohol, smoking; obtaining 7 to 9 hours of restorative sleep, at least 150 minutes of moderate intensity exercise weekly, the importance of healthy social connections,  and stress reduction techniques were discussed. C.  A full color page of  Calorie density of various food groups per pound showing examples of each food groups was provided to the patient.  - I have counseled him on diet and weight management by adopting a carbohydrate restricted/protein rich diet. Patient is encouraged to switch to unprocessed or minimally processed complex starch and increased protein intake (animal or plant source), fruits, and vegetables. -  he is advised to stick to a routine mealtimes to eat 3 meals a day and avoid unnecessary snacks (to snack only to correct hypoglycemia).   - he acknowledges that there is a room  for improvement in his food and drink choices. - Suggestion is made for him to avoid simple carbohydrates from his diet including Cakes, Sweet  Desserts, Ice Cream, Soda (diet and regular), Sweet Tea, Candies, Chips, Cookies, Store Bought Juices, Alcohol in Excess of 1-2 drinks a day, Artificial Sweeteners, Coffee Creamer, and "Sugar-free" Products. This will help patient to have more stable blood glucose profile and potentially avoid unintended weight gain.  - I have approached him with the following individualized plan to manage his diabetes and patient agrees:   -I did not make any setting changes to his Omnipod 5 today.  He notes difficulty getting the pods to stay on, I did give him sample of skin tac to try and see if it helps.  I will also reach out to Omnipod to get him connected with the clinic.  Target glucose is set at 130 with correct if above 150; insulin  to carb ratio is 1:10; correction factor of 42; and insulin  duration of 4 hrs.  -he is encouraged to start/continue monitoring glucose 4 times daily (using his CGM), before meals and before bed, to log their readings on the clinic sheets provided, and bring them to review at follow up appointment in 3 months.  - he is warned not to take insulin  without proper monitoring per orders. - Adjustment parameters are given to him for hypo and hyperglycemia in writing. - he is encouraged to call clinic for blood glucose levels less than 70 or above 300 mg /dl.  -Given LADA, he will be managed as type 1, thus insulin  is the exclusive choice of treatment for him.  - Specific targets for  A1c; LDL, HDL, and Triglycerides were discussed with the patient.  2) Blood Pressure /Hypertension:  his blood pressure is controlled to target.   he is advised to continue his current medications as prescribed by his PCP.  3) Lipids/Hyperlipidemia:    Review of his recent lipid panel from 12/20/22 showed uncontrolled LDL at 181 .  he is advised to continue  Crestor 40 mg daily at bedtime.  Side effects and precautions discussed with him.  4)  Weight/Diet:  his Body mass index is 32.31 kg/m.  -   he is not a candidate for major weight loss. I discussed with him the fact that loss of 5 - 10% of his  current body weight will have the most impact on his diabetes management.  Exercise, and detailed carbohydrates information provided  -  detailed on discharge instructions.  5) Chronic Care/Health Maintenance: -he is not on ACEI/ARB and is on Statin medications and is encouraged to initiate and continue to follow up with Ophthalmology, Dentist, Podiatrist at least yearly or according to recommendations, and advised to stay away from smoking. I have recommended yearly flu vaccine and pneumonia vaccine at least every 5 years; moderate intensity exercise for up to 150 minutes weekly; and sleep for at least 7 hours a day.  - he is advised to maintain close follow up with McCaskill-Gainey, Tenesa, FNP for primary care needs, as well as his other providers for optimal and coordinated care.   - Time spent in this patient care: 60 min, which was spent in counseling him about his diabetes and the rest reviewing his blood glucose logs, discussing his hypoglycemia and hyperglycemia episodes, reviewing his current and previous labs/studies (including abstraction from other facilities) and medications doses and developing a long term treatment plan based on the latest standards of care/guidelines; and documenting his care.    Please refer to Patient Instructions for Blood Glucose Monitoring and Insulin /Medications Dosing Guide" in media tab for additional information. Please also refer to "  Patient Self Inventory" in the Media tab for reviewed elements of pertinent patient history.  Willistine Hartigan participated in the discussions, expressed understanding, and voiced agreement with the above plans.  All questions were answered to his satisfaction. he is encouraged to contact  clinic should he have any questions or concerns prior to his return visit.     Follow up plan: - Return in about 4 months (around 10/03/2023) for Diabetes F/U with A1c in office, No previsit labs.    Hulon Magic, [facility] [address] [place], [place] [address] Phone: [phone] Fax: [phone]  06/03/2023, 12:16 PM

## 2023-08-12 ENCOUNTER — Encounter (INDEPENDENT_AMBULATORY_CARE_PROVIDER_SITE_OTHER): Payer: Self-pay | Admitting: *Deleted

## 2023-10-08 ENCOUNTER — Encounter: Payer: Self-pay | Admitting: Nurse Practitioner

## 2023-10-08 ENCOUNTER — Ambulatory Visit: Admitting: Nurse Practitioner

## 2023-10-08 VITALS — BP 136/86 | HR 94 | Ht 71.0 in | Wt 233.6 lb

## 2023-10-08 DIAGNOSIS — Z794 Long term (current) use of insulin: Secondary | ICD-10-CM | POA: Diagnosis not present

## 2023-10-08 DIAGNOSIS — E139 Other specified diabetes mellitus without complications: Secondary | ICD-10-CM | POA: Diagnosis not present

## 2023-10-08 DIAGNOSIS — E782 Mixed hyperlipidemia: Secondary | ICD-10-CM

## 2023-10-08 LAB — POCT GLYCOSYLATED HEMOGLOBIN (HGB A1C): Hemoglobin A1C: 7.9 % — AB (ref 4.0–5.6)

## 2023-10-08 MED ORDER — OMNIPOD 5 DEXG7G6 PODS GEN 5 MISC: 3 refills | Status: DC

## 2023-10-08 NOTE — Progress Notes (Signed)
 Endocrinology Follow Up Note       10/08/2023, 2:59 PM   Subjective:    Patient ID: Roy Savage, male    DOB: 1980/10/03.  Roy Savage is being seen in follow up after being seen in consultation for management of currently uncontrolled symptomatic diabetes requested by  McCaskill-Gainey, Tenesa, FNP.   Past Medical History:  Diagnosis Date   Diabetes mellitus without complication (HCC)     History reviewed. No pertinent surgical history.  Social History   Socioeconomic History   Marital status: Single    Spouse name: Not on file   Number of children: Not on file   Years of education: Not on file   Highest education level: Not on file  Occupational History   Not on file  Tobacco Use   Smoking status: Never   Smokeless tobacco: Never  Vaping Use   Vaping status: Never Used  Substance and Sexual Activity   Alcohol use: No   Drug use: No   Sexual activity: Yes    Birth control/protection: None  Other Topics Concern   Not on file  Social History Narrative   Not on file   Social Drivers of Health   Financial Resource Strain: Not on file  Food Insecurity: Not on file  Transportation Needs: Not on file  Physical Activity: Not on file  Stress: Not on file  Social Connections: Not on file    Family History  Problem Relation Age of Onset   Diabetes Other     Outpatient Encounter Medications as of 10/08/2023  Medication Sig   Continuous Glucose Sensor (DEXCOM G7 SENSOR) MISC    insulin  lispro (HUMALOG ) 100 UNIT/ML injection Use with Omnipod for TDD around 80 units   losartan (COZAAR) 25 MG tablet Take 25 mg by mouth daily.   rosuvastatin (CRESTOR) 40 MG tablet Take 40 mg by mouth daily.   [DISCONTINUED] Insulin  Disposable Pump (OMNIPOD 5 DEXG7G6 PODS GEN 5) MISC Inject into the skin.   Insulin  Disposable Pump (OMNIPOD 5 DEXG7G6 PODS GEN 5) MISC Change pod every 48 hours as directed   No  facility-administered encounter medications on file as of 10/08/2023.    ALLERGIES: Allergies  Allergen Reactions   Shellfish Allergy Hives and Rash    VACCINATION STATUS: Immunization History  Administered Date(s) Administered   Influenza,inj,Quad PF,6+ Mos 11/06/2013   Pneumococcal Polysaccharide-23 11/06/2013    Diabetes He presents for his follow-up diabetic visit. 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 Onset time: diagnosed at approx age of 75. His disease course has been stable. There are no hypoglycemic associated symptoms. There are no diabetic associated symptoms. Pertinent negatives for diabetes include no polyuria. There are no hypoglycemic complications. Diabetic complications include nephropathy and retinopathy. (Reports DKA 2-3 years ago) Risk factors for coronary artery disease include diabetes mellitus, dyslipidemia, male sex, hypertension and stress. Current diabetic treatment includes insulin  pump (Omnipod 5). His weight is fluctuating minimally. He is following a generally unhealthy diet. When asked about meal planning, he reported none. He has not had a previous visit with a dietitian. He participates in exercise intermittently. His home blood glucose trend is fluctuating minimally. His overall blood glucose range  is 140-180 mg/dl. (He presents today with his Omnipod and CGM showing fluctuating but stable glycemic profile overall.  His POCT A1c today is 7.9%, unchanged from last visit.  Analysis of his CGM shows TIR 58%, TAR 41%, TBR 1% with a GMI of 7.6%.  He admits he sometimes forgets to bolus prior to meals.  He also notes he has been experimenting with different pod sites- finds that his thigh does not hold well.  He is not connected with [facility] at our office.) An ACE inhibitor/angiotensin II receptor blocker is not being taken. He sees a podiatrist.Eye exam is current.     Review of systems  Constitutional: + increasing body weight, current Body mass index is 32.58  kg/m., no fatigue, no subjective hyperthermia, no subjective hypothermia Eyes: no blurry vision, no xerophthalmia ENT: no sore throat, no nodules palpated in throat, no dysphagia/odynophagia, no hoarseness Cardiovascular: no chest pain, no shortness of breath, no palpitations, no leg swelling Respiratory: no cough, no shortness of breath Gastrointestinal: no nausea/vomiting/diarrhea Musculoskeletal: + intermittent diffuse muscle/joint aches Skin: no rashes, no hyperemia Neurological: no tremors, no numbness, no tingling, no dizziness Psychiatric: no depression, no anxiety  Objective:     BP 136/86 (BP Location: Left Arm, Patient Position: Sitting, Cuff Size: Large)   Pulse 94   Ht 5' 11 (1.803 m)   Wt 233 lb 9.6 oz (106 kg)   BMI 32.58 kg/m   Wt Readings from Last 3 Encounters:  10/08/23 233 lb 9.6 oz (106 kg)  06/03/23 225 lb 3.2 oz (102.2 kg)  12/16/21 210 lb (95.3 kg)     BP Readings from Last 3 Encounters:  10/08/23 136/86  06/03/23 136/86  03/25/23 (!) 145/91     Physical Exam- Limited  Constitutional:  Body mass index is 32.58 kg/m. , not in acute distress, somewhat distracted state of mind (answered phone call and texts during visit) Eyes:  EOMI, no exophthalmos Musculoskeletal: no gross deformities, strength intact in all four extremities, no gross restriction of joint movements Skin:  no rashes, no hyperemia Neurological: no tremor with outstretched hands   Diabetic Foot Exam - Simple   Simple Foot Form Diabetic Foot exam was performed with the following findings: Yes 10/08/2023  2:39 PM  Visual Inspection No deformities, no ulcerations, no other skin breakdown bilaterally: Yes Sensation Testing Intact to touch and monofilament testing bilaterally: Yes Pulse Check Posterior Tibialis and Dorsalis pulse intact bilaterally: Yes Comments      CMP ( most recent) CMP     Component Value Date/Time   NA 139 11/06/2013 0931   K 4.5 11/06/2013 0931   CL  103 11/06/2013 0931   CO2 21 11/06/2013 0931   GLUCOSE 187 (H) 11/06/2013 0931   BUN 28 (A) 03/04/2023 0000   CREATININE 1.5 (A) 03/04/2023 0000   CREATININE 1.03 11/06/2013 0931   CALCIUM  9.5 11/06/2013 0931   PROT 8.1 11/06/2013 0409   ALBUMIN 4.2 11/06/2013 0409   AST 18 11/06/2013 0409   ALT 20 11/06/2013 0409   ALKPHOS 56 11/06/2013 0409   BILITOT 1.0 11/06/2013 0409   EGFR 57 03/04/2023 0000   GFRNONAA >90 11/06/2013 0931     Diabetic Labs (most recent): Lab Results  Component Value Date   HGBA1C 7.9 (A) 10/08/2023   HGBA1C 7.9 (A) 06/03/2023   HGBA1C 10.3 11/26/2022     Lipid Panel ( most recent) Lipid Panel     Component Value Date/Time   TRIG 101 12/20/2022 0000   LDLCALC  181 12/20/2022 0000      Lab Results  Component Value Date   TSH 1.50 12/20/2022           Assessment & Plan:   1) Latent autoimmune diabetes in adults (LADA), managed as type 1 (HCC) (Primary)  He presents today with his Omnipod and CGM showing fluctuating but stable glycemic profile overall.  His POCT A1c today is 7.9%, unchanged from last visit.  Analysis of his CGM shows TIR 58%, TAR 41%, TBR 1% with a GMI of 7.6%.  He admits he sometimes forgets to bolus prior to meals.  He also notes he has been experimenting with different pod sites- finds that his thigh does not hold well.  He is not connected with [facility] at our office.  - Franky LELON Pepper has currently uncontrolled symptomatic type LADA since 43 years of age.   -Recent labs reviewed.  - I had a long discussion with him about the progressive nature of diabetes and the pathology behind its complications. -his diabetes is currently complicated by retinopathy and moderate CKD but he remains at a high risk for more acute and chronic complications which include CAD, CVA, CKD, retinopathy, and neuropathy. These are all discussed in detail with him.  The following Lifestyle Medicine recommendations according to American College of  Lifestyle Medicine [facility]) were discussed and offered to patient and he agrees to start the journey:  A. Whole Foods, Plant-based plate comprising of fruits and vegetables, plant-based proteins, whole-grain carbohydrates was discussed in detail with the patient.   A list for source of those nutrients were also provided to the patient.  Patient will use only water or unsweetened tea for hydration. B.  The need to stay away from risky substances including alcohol, smoking; obtaining 7 to 9 hours of restorative sleep, at least 150 minutes of moderate intensity exercise weekly, the importance of healthy social connections,  and stress reduction techniques were discussed. C.  A full color page of  Calorie density of various food groups per pound showing examples of each food groups was provided to the patient.  - Nutritional counseling repeated at each appointment due to patients tendency to fall back in to old habits.  - The patient admits there is a room for improvement in their diet and drink choices. -  Suggestion is made for the patient to avoid simple carbohydrates from their diet including Cakes, Sweet Desserts / Pastries, Ice Cream, Soda (diet and regular), Sweet Tea, Candies, Chips, Cookies, Sweet Pastries, Store Bought Juices, Alcohol in Excess of 1-2 drinks a day, Artificial Sweeteners, Coffee Creamer, and Sugar-free Products. This will help patient to have stable blood glucose profile and potentially avoid unintended weight gain.   - I encouraged the patient to switch to unprocessed or minimally processed complex starch and increased protein intake (animal or plant source), fruits, and vegetables.   - Patient is advised to stick to a routine mealtimes to eat 3 meals a day and avoid unnecessary snacks (to snack only to correct hypoglycemia).  - I have approached him with the following individualized plan to manage his diabetes and patient agrees:   -I did not make any setting changes to his  Omnipod 5 today.  I gave patient Julias number today so he can reach out to get set up with our [facility] account.  Target glucose is set at 130 with correct if above 150; insulin  to carb ratio is 1:10; correction factor of 42; and insulin  duration of 4 hrs.  -  he is encouraged to start/continue monitoring glucose 4 times daily (using his CGM), before meals and before bed, and to call the clinic if he has readings less than 70 or above 300 for 3 tests in a row.  - he is warned not to take insulin  without proper monitoring per orders. - Adjustment parameters are given to him for hypo and hyperglycemia in writing.  -Given LADA, he will be managed as type 1, thus insulin  is the exclusive choice of treatment for him.  - Specific targets for  A1c; LDL, HDL, and Triglycerides were discussed with the patient.  2) Blood Pressure /Hypertension:  his blood pressure is controlled to target.   he is advised to continue his current medications as prescribed by his PCP.  3) Lipids/Hyperlipidemia:    Review of his recent lipid panel from 12/20/22 showed uncontrolled LDL at 181 .  he is advised to continue Crestor 40 mg daily at bedtime.  Side effects and precautions discussed with him.  4)  Weight/Diet:  his Body mass index is 32.58 kg/m.  -   he is not a candidate for major weight loss. I discussed with him the fact that loss of 5 - 10% of his  current body weight will have the most impact on his diabetes management.  Exercise, and detailed carbohydrates information provided  -  detailed on discharge instructions.  5) Chronic Care/Health Maintenance: -he is not on ACEI/ARB and is on Statin medications and is encouraged to initiate and continue to follow up with Ophthalmology, Dentist, Podiatrist at least yearly or according to recommendations, and advised to stay away from smoking. I have recommended yearly flu vaccine and pneumonia vaccine at least every 5 years; moderate intensity exercise for up to 150  minutes weekly; and sleep for at least 7 hours a day.  - he is advised to maintain close follow up with McCaskill-Gainey, Tenesa, FNP for primary care needs, as well as his other providers for optimal and coordinated care.     I spent  40  minutes in the care of the patient today including review of labs from CMP, Lipids, Thyroid Function, Hematology (current and previous including abstractions from other facilities); face-to-face time discussing  his blood glucose readings/logs, discussing hypoglycemia and hyperglycemia episodes and symptoms, medications doses, his options of short and long term treatment based on the latest standards of care / guidelines;  discussion about incorporating lifestyle medicine;  and documenting the encounter. Risk reduction counseling performed per USPSTF guidelines to reduce obesity and cardiovascular risk factors.     Please refer to Patient Instructions for Blood Glucose Monitoring and Insulin /Medications Dosing Guide  in media tab for additional information. Please  also refer to  Patient Self Inventory in the Media  tab for reviewed elements of pertinent patient history.  Franky LELON Pepper participated in the discussions, expressed understanding, and voiced agreement with the above plans.  All questions were answered to his satisfaction. he is encouraged to contact clinic should he have any questions or concerns prior to his return visit.     Follow up plan: - Return in about 4 months (around 02/07/2024) for Diabetes F/U with A1c in office, No previsit labs, Bring meter and logs.   Benton Rio, [facility] [address] [place], [place] [address] Phone: [phone] Fax: [phone]  10/08/2023, 2:59 PM

## 2024-02-10 ENCOUNTER — Encounter: Payer: Self-pay | Admitting: Nurse Practitioner

## 2024-02-10 ENCOUNTER — Ambulatory Visit: Admitting: Nurse Practitioner

## 2024-02-10 VITALS — BP 130/80 | HR 71 | Ht 70.0 in | Wt 236.4 lb

## 2024-02-10 DIAGNOSIS — Z794 Long term (current) use of insulin: Secondary | ICD-10-CM | POA: Diagnosis not present

## 2024-02-10 DIAGNOSIS — Z7984 Long term (current) use of oral hypoglycemic drugs: Secondary | ICD-10-CM | POA: Diagnosis not present

## 2024-02-10 DIAGNOSIS — E782 Mixed hyperlipidemia: Secondary | ICD-10-CM

## 2024-02-10 DIAGNOSIS — E139 Other specified diabetes mellitus without complications: Secondary | ICD-10-CM

## 2024-02-10 LAB — POCT GLYCOSYLATED HEMOGLOBIN (HGB A1C): Hemoglobin A1C: 8.2 % — AB (ref 4.0–5.6)

## 2024-02-10 MED ORDER — INSULIN LISPRO 100 UNIT/ML IJ SOLN: INTRAMUSCULAR | 3 refills | Status: AC

## 2024-02-10 MED ORDER — DEXCOM G7 SENSOR MISC: 3 refills | Status: AC

## 2024-02-10 MED ORDER — OMNIPOD 5 DEXG7G6 PODS GEN 5 MISC: 3 refills | Status: AC

## 2024-02-10 NOTE — Progress Notes (Signed)
 "                                                                        Endocrinology Follow Up Note       02/10/2024, 3:41 PM   Subjective:    Patient ID: Roy Savage, male    DOB: November 08, 1980.  Roy Savage is being seen in follow up after being seen in consultation for management of currently uncontrolled symptomatic diabetes requested by  McCaskill-Gainey, Tenesa, FNP (Inactive).   Past Medical History:  Diagnosis Date   Diabetes mellitus without complication (HCC)     History reviewed. No pertinent surgical history.  Social History   Socioeconomic History   Marital status: Single    Spouse name: Not on file   Number of children: Not on file   Years of education: Not on file   Highest education level: Not on file  Occupational History   Not on file  Tobacco Use   Smoking status: Never   Smokeless tobacco: Never  Vaping Use   Vaping status: Never Used  Substance and Sexual Activity   Alcohol use: No   Drug use: No   Sexual activity: Yes    Birth control/protection: None  Other Topics Concern   Not on file  Social History Narrative   Not on file   Social Drivers of Health   Tobacco Use: Low Risk (02/10/2024)   Patient History    Smoking Tobacco Use: Never    Smokeless Tobacco Use: Never    Passive Exposure: Not on file  Financial Resource Strain: Not on file  Food Insecurity: Not on file  Transportation Needs: Not on file  Physical Activity: Not on file  Stress: Not on file  Social Connections: Not on file  Depression (EYV7-0): Not on file  Alcohol Screen: Not on file  Housing: Not on file  Utilities: Not on file  Health Literacy: Not on file    Family History  Problem Relation Age of Onset   Diabetes Other     Outpatient Encounter Medications as of 02/10/2024  Medication Sig   losartan (COZAAR) 25 MG tablet Take 25 mg by mouth daily.   rosuvastatin (CRESTOR) 40 MG tablet Take 40 mg by mouth daily.   [DISCONTINUED] Continuous Glucose Sensor  (DEXCOM G7 SENSOR) MISC    [DISCONTINUED] insulin  lispro (HUMALOG ) 100 UNIT/ML injection Use with Omnipod for TDD around 80 units   Continuous Glucose Sensor (DEXCOM G7 SENSOR) MISC Change sensor every 10 days   Insulin  Disposable Pump (OMNIPOD 5 DEXG7G6 PODS GEN 5) MISC Change pod every 48 hours as directed   insulin  lispro (HUMALOG ) 100 UNIT/ML injection Use with Omnipod for TDD around 80 units   [DISCONTINUED] Insulin  Disposable Pump (OMNIPOD 5 DEXG7G6 PODS GEN 5) MISC Change pod every 48 hours as directed   No facility-administered encounter medications on file as of 02/10/2024.    ALLERGIES: Allergies  Allergen Reactions   Shellfish Allergy Hives and Rash    VACCINATION STATUS: Immunization History  Administered Date(s) Administered   Influenza,inj,Quad PF,6+ Mos 11/06/2013   Pneumococcal Polysaccharide-23 11/06/2013    Diabetes He presents for his follow-up diabetic visit. 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 Onset time: diagnosed at approx  age of 73. His disease course has been stable. There are no hypoglycemic associated symptoms. There are no diabetic associated symptoms. Pertinent negatives for diabetes include no polyuria. There are no hypoglycemic complications. Diabetic complications include nephropathy and retinopathy. (Reports DKA 2-3 years ago) Risk factors for coronary artery disease include diabetes mellitus, dyslipidemia, male sex, hypertension and stress. Current diabetic treatment includes insulin  pump (Omnipod 5). His weight is fluctuating minimally. He is following a generally unhealthy diet. When asked about meal planning, he reported none. He has not had a previous visit with a dietitian. He participates in exercise intermittently. His home blood glucose trend is fluctuating minimally. His overall blood glucose range is 140-180 mg/dl. (He presents today with his Omnipod and CGM showing fluctuating but stable glycemic profile overall.  His POCT A1c today is 8.2%,  increasing from last visit of 7.9%.  Analysis of his CGM shows TIR 65%, TAR 34%, TBR 1% with a GMI of 7.5%.  He notes his arms are getting worn out from wearing his pump.  He never got a call from Omnipod team to set his Gl[employer] account up with us .) An ACE inhibitor/angiotensin II receptor blocker is not being taken. He sees a podiatrist.Eye exam is current.     Review of systems  Constitutional: + increasing body weight, current Body mass index is 33.92 kg/m., no fatigue, no subjective hyperthermia, no subjective hypothermia Eyes: no blurry vision, no xerophthalmia ENT: no sore throat, no nodules palpated in throat, no dysphagia/odynophagia, no hoarseness Cardiovascular: no chest pain, no shortness of breath, no palpitations, no leg swelling Respiratory: no cough, no shortness of breath Gastrointestinal: no nausea/vomiting/diarrhea Musculoskeletal: + intermittent diffuse muscle/joint aches Skin: no rashes, no hyperemia Neurological: no tremors, no numbness, no tingling, no dizziness Psychiatric: no depression, no anxiety  Objective:     BP 130/80 (BP Location: Left Arm, Patient Position: Standing, Cuff Size: Large)   Pulse 71   Ht 5' 10 (1.778 m)   Wt 236 lb 6.4 oz (107.2 kg)   BMI 33.92 kg/m   Wt Readings from Last 3 Encounters:  02/10/24 236 lb 6.4 oz (107.2 kg)  10/08/23 233 lb 9.6 oz (106 kg)  06/03/23 225 lb 3.2 oz (102.2 kg)     BP Readings from Last 3 Encounters:  02/10/24 130/80  10/08/23 136/86  06/03/23 136/86     Physical Exam- Limited  Constitutional:  Body mass index is 33.92 kg/m. , not in acute distress, normal state of mind Eyes:  EOMI, no exophthalmos Musculoskeletal: no gross deformities, strength intact in all four extremities, no gross restriction of joint movements Skin:  no rashes, no hyperemia Neurological: no tremor with outstretched hands   Diabetic Foot Exam - Simple   No data filed      CMP ( most recent) CMP     Component Value  Date/Time   NA 139 11/06/2013 0931   K 4.5 11/06/2013 0931   CL 103 11/06/2013 0931   CO2 21 11/06/2013 0931   GLUCOSE 187 (H) 11/06/2013 0931   BUN 28 (A) 03/04/2023 0000   CREATININE 1.5 (A) 03/04/2023 0000   CREATININE 1.03 11/06/2013 0931   CALCIUM  9.5 11/06/2013 0931   PROT 8.1 11/06/2013 0409   ALBUMIN 4.2 11/06/2013 0409   AST 18 11/06/2013 0409   ALT 20 11/06/2013 0409   ALKPHOS 56 11/06/2013 0409   BILITOT 1.0 11/06/2013 0409   EGFR 57 03/04/2023 0000   GFRNONAA >90 11/06/2013 0931     Diabetic Labs (most  recent): Lab Results  Component Value Date   HGBA1C 8.2 (A) 02/10/2024   HGBA1C 7.9 (A) 10/08/2023   HGBA1C 7.9 (A) 06/03/2023     Lipid Panel ( most recent) Lipid Panel     Component Value Date/Time   TRIG 101 12/20/2022 0000   LDLCALC 181 12/20/2022 0000      Lab Results  Component Value Date   TSH 1.50 12/20/2022           Assessment & Plan:   1) Latent autoimmune diabetes in adults (LADA), managed as type 1 (HCC) (Primary)  He presents today with his Omnipod and CGM showing fluctuating but stable glycemic profile overall.  His POCT A1c today is 8.2%, increasing from last visit of 7.9%.  Analysis of his CGM shows TIR 65%, TAR 34%, TBR 1% with a GMI of 7.5%.  He notes his arms are getting worn out from wearing his pump.  He never got a call from Omnipod team to set his Gl[employer] account up with us .  - TENZIN EDELMAN has currently uncontrolled symptomatic type LADA since 44 years of age.   -Recent labs reviewed.  - I had a long discussion with him about the progressive nature of diabetes and the pathology behind its complications. -his diabetes is currently complicated by retinopathy and moderate CKD but he remains at a high risk for more acute and chronic complications which include CAD, CVA, CKD, retinopathy, and neuropathy. These are all discussed in detail with him.  The following Lifestyle Medicine recommendations according to American College  of Lifestyle Medicine [facility]) were discussed and offered to patient and he agrees to start the journey:  A. Whole Foods, Plant-based plate comprising of fruits and vegetables, plant-based proteins, whole-grain carbohydrates was discussed in detail with the patient.   A list for source of those nutrients were also provided to the patient.  Patient will use only water or unsweetened tea for hydration. B.  The need to stay away from risky substances including alcohol, smoking; obtaining 7 to 9 hours of restorative sleep, at least 150 minutes of moderate intensity exercise weekly, the importance of healthy social connections,  and stress reduction techniques were discussed. C.  A full color page of  Calorie density of various food groups per pound showing examples of each food groups was provided to the patient.  - Nutritional counseling repeated/built upon at each appointment.  - The patient admits there is a room for improvement in their diet and drink choices. -  Suggestion is made for the patient to avoid simple carbohydrates from their diet including Cakes, Sweet Desserts / Pastries, Ice Cream, Soda (diet and regular), Sweet Tea, Candies, Chips, Cookies, Sweet Pastries, Store Bought Juices, Alcohol in Excess of 1-2 drinks a day, Artificial Sweeteners, Coffee Creamer, and Sugar-free Products. This will help patient to have stable blood glucose profile and potentially avoid unintended weight gain.   - I encouraged the patient to switch to unprocessed or minimally processed complex starch and increased protein intake (animal or plant source), fruits, and vegetables.   - Patient is advised to stick to a routine mealtimes to eat 3 meals a day and avoid unnecessary snacks (to snack only to correct hypoglycemia).  - I have approached him with the following individualized plan to manage his diabetes and patient agrees:   -I did adjust his target glucose and correct above glucose to 110 today (tightening  from 130-150).   Target glucose is set at 130 with correct if above  150; insulin  to carb ratio is 1:10; correction factor of 42; and insulin  duration of 4 hrs.  I did send email to my Omnipod team for assistance in getting him set up with our Glooko account and also for more guidance on transitioning to app on his phone (instead of the PDM).  -he is encouraged to start/continue monitoring glucose 4 times daily (using his CGM), before meals and before bed, and to call the clinic if he has readings less than 70 or above 300 for 3 tests in a row.  - he is warned not to take insulin  without proper monitoring per orders. - Adjustment parameters are given to him for hypo and hyperglycemia in writing.  -Given LADA, he will be managed as type 1, thus insulin  is the exclusive choice of treatment for him.  - Specific targets for  A1c; LDL, HDL, and Triglycerides were discussed with the patient.  2) Blood Pressure /Hypertension:  his blood pressure is controlled to target.   he is advised to continue his current medications as prescribed by his PCP.  3) Lipids/Hyperlipidemia:    Review of his recent lipid panel from 10/23/23 showed controlled LDL at 91 .  he is advised to continue Crestor 40 mg daily at bedtime.  Side effects and precautions discussed with him.  4)  Weight/Diet:  his Body mass index is 33.92 kg/m.  -   he is not a candidate for major weight loss. I discussed with him the fact that loss of 5 - 10% of his  current body weight will have the most impact on his diabetes management.  Exercise, and detailed carbohydrates information provided  -  detailed on discharge instructions.  5) Chronic Care/Health Maintenance: -he is not on ACEI/ARB and is on Statin medications and is encouraged to initiate and continue to follow up with Ophthalmology, Dentist, Podiatrist at least yearly or according to recommendations, and advised to stay away from smoking. I have recommended yearly flu vaccine and  pneumonia vaccine at least every 5 years; moderate intensity exercise for up to 150 minutes weekly; and sleep for at least 7 hours a day.  - he is advised to maintain close follow up with McCaskill-Gainey, Tenesa, FNP (Inactive) for primary care needs, as well as his other providers for optimal and coordinated care.     I spent  45  minutes in the care of the patient today including review of labs from CMP, Lipids, Thyroid Function, Hematology (current and previous including abstractions from other facilities); face-to-face time discussing  his blood glucose readings/logs, discussing hypoglycemia and hyperglycemia episodes and symptoms, medications doses, his options of short and long term treatment based on the latest standards of care / guidelines;  discussion about incorporating lifestyle medicine;  and documenting the encounter. Risk reduction counseling performed per USPSTF guidelines to reduce obesity and cardiovascular risk factors.     Please refer to Patient Instructions for Blood Glucose Monitoring and Insulin /Medications Dosing Guide  in media tab for additional information. Please  also refer to  Patient Self Inventory in the Media  tab for reviewed elements of pertinent patient history.  Franky LELON Pepper participated in the discussions, expressed understanding, and voiced agreement with the above plans.  All questions were answered to his satisfaction. he is encouraged to contact clinic should he have any questions or concerns prior to his return visit.    Follow up plan: - Return in about 4 months (around 06/09/2024) for Diabetes F/U with A1c in office, No  previsit labs, Bring meter and logs.   Benton Rio, [facility] [address] [place], [place] [address] Phone: [phone] Fax: [phone]  02/10/2024, 3:41 PM    "

## 2024-06-10 ENCOUNTER — Ambulatory Visit: Admitting: Nurse Practitioner
